# Patient Record
Sex: Female | Born: 1965 | Race: White | Hispanic: No | Marital: Married | Smoking: Never smoker
Health system: Southern US, Community
[De-identification: ages and names within clinical notes are randomized; demographics above are authoritative.]

## PROBLEM LIST (undated history)

## (undated) DIAGNOSIS — I493 Ventricular premature depolarization: Secondary | ICD-10-CM

## (undated) DIAGNOSIS — Z973 Presence of spectacles and contact lenses: Secondary | ICD-10-CM

## (undated) DIAGNOSIS — R112 Nausea with vomiting, unspecified: Secondary | ICD-10-CM

## (undated) DIAGNOSIS — Z9889 Other specified postprocedural states: Secondary | ICD-10-CM

## (undated) DIAGNOSIS — E039 Hypothyroidism, unspecified: Secondary | ICD-10-CM

## (undated) DIAGNOSIS — L749 Eccrine sweat disorder, unspecified: Secondary | ICD-10-CM

## (undated) DIAGNOSIS — I10 Essential (primary) hypertension: Secondary | ICD-10-CM

## (undated) DIAGNOSIS — D649 Anemia, unspecified: Secondary | ICD-10-CM

## (undated) DIAGNOSIS — E079 Disorder of thyroid, unspecified: Secondary | ICD-10-CM

## (undated) DIAGNOSIS — F419 Anxiety disorder, unspecified: Secondary | ICD-10-CM

## (undated) DIAGNOSIS — N941 Unspecified dyspareunia: Secondary | ICD-10-CM

## (undated) HISTORY — DX: Hypothyroidism, unspecified: E03.9

## (undated) HISTORY — PX: TONSILLECTOMY: SUR1361

## (undated) HISTORY — PX: TUBAL LIGATION: SHX77

## (undated) HISTORY — DX: Essential (primary) hypertension: I10

## (undated) HISTORY — DX: Anxiety disorder, unspecified: F41.9

## (undated) HISTORY — PX: ABDOMINAL HYSTERECTOMY: SHX81

## (undated) HISTORY — DX: Unspecified dyspareunia: N94.10

## (undated) HISTORY — PX: FOOT SURGERY: SHX648

## (undated) HISTORY — DX: Anemia, unspecified: D64.9

## (undated) HISTORY — DX: Eccrine sweat disorder, unspecified: L74.9

---

## 1997-05-05 ENCOUNTER — Other Ambulatory Visit: Admission: RE | Admit: 1997-05-05 | Discharge: 1997-05-05 | Payer: Self-pay | Admitting: Obstetrics and Gynecology

## 1997-07-05 ENCOUNTER — Ambulatory Visit (HOSPITAL_COMMUNITY): Admission: RE | Admit: 1997-07-05 | Discharge: 1997-07-05 | Payer: Self-pay | Admitting: Obstetrics and Gynecology

## 1998-05-16 ENCOUNTER — Other Ambulatory Visit: Admission: RE | Admit: 1998-05-16 | Discharge: 1998-05-16 | Payer: Self-pay | Admitting: *Deleted

## 1998-12-02 ENCOUNTER — Encounter: Payer: Self-pay | Admitting: *Deleted

## 1998-12-02 ENCOUNTER — Ambulatory Visit (HOSPITAL_COMMUNITY): Admission: RE | Admit: 1998-12-02 | Discharge: 1998-12-02 | Payer: Self-pay | Admitting: *Deleted

## 1999-07-13 ENCOUNTER — Ambulatory Visit (HOSPITAL_COMMUNITY): Admission: RE | Admit: 1999-07-13 | Discharge: 1999-07-13 | Payer: Self-pay | Admitting: *Deleted

## 1999-09-19 ENCOUNTER — Inpatient Hospital Stay (HOSPITAL_COMMUNITY): Admission: AD | Admit: 1999-09-19 | Discharge: 1999-09-19 | Payer: Self-pay | Admitting: *Deleted

## 1999-09-20 ENCOUNTER — Inpatient Hospital Stay (HOSPITAL_COMMUNITY): Admission: AD | Admit: 1999-09-20 | Discharge: 1999-09-22 | Payer: Self-pay | Admitting: Obstetrics & Gynecology

## 1999-10-20 ENCOUNTER — Other Ambulatory Visit: Admission: RE | Admit: 1999-10-20 | Discharge: 1999-10-20 | Payer: Self-pay | Admitting: *Deleted

## 2000-12-24 ENCOUNTER — Other Ambulatory Visit: Admission: RE | Admit: 2000-12-24 | Discharge: 2000-12-24 | Payer: Self-pay | Admitting: *Deleted

## 2002-02-02 ENCOUNTER — Other Ambulatory Visit: Admission: RE | Admit: 2002-02-02 | Discharge: 2002-02-02 | Payer: Self-pay | Admitting: *Deleted

## 2002-11-17 ENCOUNTER — Other Ambulatory Visit: Admission: RE | Admit: 2002-11-17 | Discharge: 2002-11-17 | Payer: Self-pay | Admitting: *Deleted

## 2002-12-02 ENCOUNTER — Inpatient Hospital Stay (HOSPITAL_COMMUNITY): Admission: AD | Admit: 2002-12-02 | Discharge: 2002-12-02 | Payer: Self-pay | Admitting: Obstetrics and Gynecology

## 2003-01-13 ENCOUNTER — Inpatient Hospital Stay (HOSPITAL_COMMUNITY): Admission: AD | Admit: 2003-01-13 | Discharge: 2003-01-13 | Payer: Self-pay | Admitting: Obstetrics and Gynecology

## 2003-03-18 ENCOUNTER — Ambulatory Visit (HOSPITAL_COMMUNITY): Admission: RE | Admit: 2003-03-18 | Discharge: 2003-03-18 | Payer: Self-pay | Admitting: Obstetrics and Gynecology

## 2003-06-01 ENCOUNTER — Inpatient Hospital Stay (HOSPITAL_COMMUNITY): Admission: AD | Admit: 2003-06-01 | Discharge: 2003-06-04 | Payer: Self-pay | Admitting: *Deleted

## 2003-07-07 ENCOUNTER — Other Ambulatory Visit: Admission: RE | Admit: 2003-07-07 | Discharge: 2003-07-07 | Payer: Self-pay | Admitting: Obstetrics & Gynecology

## 2004-08-15 ENCOUNTER — Other Ambulatory Visit: Admission: RE | Admit: 2004-08-15 | Discharge: 2004-08-15 | Payer: Self-pay | Admitting: Obstetrics and Gynecology

## 2005-11-23 ENCOUNTER — Emergency Department: Payer: Self-pay | Admitting: Emergency Medicine

## 2005-11-23 ENCOUNTER — Other Ambulatory Visit: Payer: Self-pay

## 2006-01-03 ENCOUNTER — Ambulatory Visit: Payer: Self-pay | Admitting: Obstetrics and Gynecology

## 2006-05-08 ENCOUNTER — Emergency Department: Payer: Self-pay | Admitting: Unknown Physician Specialty

## 2006-08-15 ENCOUNTER — Ambulatory Visit: Payer: Self-pay | Admitting: Gastroenterology

## 2006-08-16 ENCOUNTER — Ambulatory Visit: Payer: Self-pay | Admitting: Internal Medicine

## 2006-08-26 ENCOUNTER — Ambulatory Visit: Payer: Self-pay | Admitting: Gastroenterology

## 2006-09-10 ENCOUNTER — Ambulatory Visit: Payer: Self-pay | Admitting: Gastroenterology

## 2006-10-16 ENCOUNTER — Ambulatory Visit: Payer: Self-pay | Admitting: Internal Medicine

## 2007-03-13 ENCOUNTER — Ambulatory Visit: Payer: Self-pay | Admitting: Obstetrics and Gynecology

## 2007-05-28 ENCOUNTER — Ambulatory Visit: Payer: Self-pay | Admitting: Internal Medicine

## 2008-02-19 ENCOUNTER — Ambulatory Visit: Payer: Self-pay | Admitting: Internal Medicine

## 2008-03-16 ENCOUNTER — Ambulatory Visit: Payer: Self-pay | Admitting: Obstetrics and Gynecology

## 2008-09-14 ENCOUNTER — Other Ambulatory Visit: Payer: Self-pay | Admitting: Physician Assistant

## 2009-03-17 ENCOUNTER — Ambulatory Visit: Payer: Self-pay

## 2009-06-15 ENCOUNTER — Ambulatory Visit: Payer: Self-pay | Admitting: Obstetrics and Gynecology

## 2009-06-16 ENCOUNTER — Ambulatory Visit: Payer: Self-pay | Admitting: Obstetrics and Gynecology

## 2009-10-17 ENCOUNTER — Other Ambulatory Visit: Payer: Self-pay | Admitting: Physician Assistant

## 2009-11-25 ENCOUNTER — Other Ambulatory Visit: Payer: Self-pay

## 2010-01-06 ENCOUNTER — Other Ambulatory Visit: Payer: Self-pay

## 2010-01-06 ENCOUNTER — Ambulatory Visit: Payer: Self-pay | Admitting: General Practice

## 2010-01-12 ENCOUNTER — Ambulatory Visit: Payer: Self-pay | Admitting: General Practice

## 2010-05-02 ENCOUNTER — Ambulatory Visit: Payer: Self-pay

## 2010-08-16 ENCOUNTER — Ambulatory Visit: Payer: Self-pay | Admitting: Family Medicine

## 2010-08-17 ENCOUNTER — Ambulatory Visit: Payer: Self-pay | Admitting: Internal Medicine

## 2010-10-08 ENCOUNTER — Ambulatory Visit: Payer: Self-pay | Admitting: Family Medicine

## 2010-10-12 ENCOUNTER — Ambulatory Visit: Payer: Self-pay | Admitting: Internal Medicine

## 2011-02-25 ENCOUNTER — Ambulatory Visit: Payer: Self-pay | Admitting: Internal Medicine

## 2011-05-24 ENCOUNTER — Other Ambulatory Visit: Payer: Self-pay | Admitting: Internal Medicine

## 2011-05-24 LAB — COMPREHENSIVE METABOLIC PANEL
Albumin: 3.8 g/dL (ref 3.4–5.0)
Anion Gap: 9 (ref 7–16)
Chloride: 108 mmol/L — ABNORMAL HIGH (ref 98–107)
Co2: 27 mmol/L (ref 21–32)
Creatinine: 0.62 mg/dL (ref 0.60–1.30)
EGFR (African American): >60
EGFR (Non-African Amer.): >60
Glucose: 71 mg/dL (ref 65–99)
Osmolality: 284 (ref 275–301)
Potassium: 3.9 mmol/L (ref 3.5–5.1)
SGOT(AST): 21 U/L (ref 15–37)
SGPT (ALT): 18 U/L
Sodium: 144 mmol/L (ref 136–145)

## 2011-05-24 LAB — CBC WITH DIFFERENTIAL/PLATELET
Basophil #: 0 10*3/uL (ref 0.0–0.1)
Basophil %: 1.1 %
Eosinophil #: 0 10*3/uL (ref 0.0–0.7)
Lymphocyte %: 33.1 %
MCH: 25.2 pg — ABNORMAL LOW (ref 26.0–34.0)
MCV: 79 fL — ABNORMAL LOW (ref 80–100)
Neutrophil %: 56.9 %
Platelet: 192 10*3/uL (ref 150–440)
RBC: 4.48 10*6/uL (ref 3.80–5.20)

## 2011-05-29 ENCOUNTER — Ambulatory Visit: Payer: Self-pay | Admitting: Internal Medicine

## 2011-05-29 LAB — IRON AND TIBC
Iron Bind.Cap.(Total): 394 ug/dL (ref 250–450)
Iron: 14 ug/dL — ABNORMAL LOW (ref 50–170)
Unbound Iron-Bind.Cap.: 380 ug/dL

## 2011-06-21 ENCOUNTER — Ambulatory Visit: Payer: Self-pay | Admitting: General Practice

## 2011-09-27 ENCOUNTER — Other Ambulatory Visit: Payer: Self-pay | Admitting: Internal Medicine

## 2011-09-27 LAB — URINALYSIS, COMPLETE
Bacteria: NONE SEEN
Bilirubin,UR: NEGATIVE
Glucose,UR: NEGATIVE mg/dL (ref 0–75)
Ketone: NEGATIVE
Leukocyte Esterase: NEGATIVE
Nitrite: NEGATIVE
Ph: 6 (ref 4.5–8.0)
Protein: NEGATIVE
Specific Gravity: 1.024 (ref 1.003–1.030)
Squamous Epithelial: 3
WBC UR: NONE SEEN /HPF (ref 0–5)

## 2011-09-27 LAB — CBC WITH DIFFERENTIAL/PLATELET
Basophil #: 0 10*3/uL (ref 0.0–0.1)
Basophil %: 1 %
Eosinophil #: 0.1 10*3/uL (ref 0.0–0.7)
Eosinophil %: 1.9 %
HCT: 35.7 % (ref 35.0–47.0)
HGB: 12.2 g/dL (ref 12.0–16.0)
Lymphocyte #: 1.1 10*3/uL (ref 1.0–3.6)
Lymphocyte %: 29.2 %
MCH: 30.6 pg (ref 26.0–34.0)
MCHC: 34.1 g/dL (ref 32.0–36.0)
MCV: 90 fL (ref 80–100)
Monocyte #: 0.3 x10 3/mm (ref 0.2–0.9)
Monocyte %: 6.5 %
Neutrophil #: 2.4 10*3/uL (ref 1.4–6.5)
Neutrophil %: 61.4 %
Platelet: 163 10*3/uL (ref 150–440)
RBC: 3.98 10*6/uL (ref 3.80–5.20)
RDW: 14.3 % (ref 11.5–14.5)
WBC: 3.9 10*3/uL (ref 3.6–11.0)

## 2011-09-27 LAB — BASIC METABOLIC PANEL
Anion Gap: 4 — ABNORMAL LOW (ref 7–16)
BUN: 11 mg/dL (ref 7–18)
Calcium, Total: 8.7 mg/dL (ref 8.5–10.1)
Chloride: 103 mmol/L (ref 98–107)
Co2: 29 mmol/L (ref 21–32)
Creatinine: 0.69 mg/dL (ref 0.60–1.30)
EGFR (African American): >60
EGFR (Non-African Amer.): >60
Glucose: 83 mg/dL (ref 65–99)
Osmolality: 270 (ref 275–301)
Potassium: 3.9 mmol/L (ref 3.5–5.1)
Sodium: 136 mmol/L (ref 136–145)

## 2011-09-27 LAB — LIPID PANEL
Cholesterol: 160 mg/dL (ref 0–200)
HDL Cholesterol: 59 mg/dL (ref 40–60)
Ldl Cholesterol, Calc: 83 mg/dL (ref 0–100)
Triglycerides: 88 mg/dL (ref 0–200)
VLDL Cholesterol, Calc: 18 mg/dL (ref 5–40)

## 2011-09-27 LAB — FOLATE: Folic Acid: 15.8 ng/mL (ref 3.1–100.0)

## 2011-11-09 ENCOUNTER — Other Ambulatory Visit: Payer: Self-pay | Admitting: Podiatry

## 2011-11-09 LAB — SGOT (AST)(ARMC): SGOT(AST): 10 U/L — ABNORMAL LOW (ref 15–37)

## 2011-11-09 LAB — ALT: SGPT (ALT): 17 U/L (ref 12–78)

## 2012-01-04 ENCOUNTER — Ambulatory Visit: Payer: Self-pay | Admitting: Obstetrics and Gynecology

## 2012-06-19 ENCOUNTER — Ambulatory Visit: Payer: Self-pay | Admitting: Obstetrics and Gynecology

## 2012-06-19 LAB — BASIC METABOLIC PANEL
BUN: 10 mg/dL (ref 7–18)
Chloride: 105 mmol/L (ref 98–107)
Co2: 27 mmol/L (ref 21–32)
EGFR (African American): >60
EGFR (Non-African Amer.): >60
Glucose: 77 mg/dL (ref 65–99)
Osmolality: 270 (ref 275–301)
Sodium: 136 mmol/L (ref 136–145)

## 2012-06-19 LAB — CBC
HCT: 38.7 % (ref 35.0–47.0)
HGB: 13.3 g/dL (ref 12.0–16.0)
MCH: 30.4 pg (ref 26.0–34.0)
MCHC: 34.2 g/dL (ref 32.0–36.0)
RBC: 4.35 10*6/uL (ref 3.80–5.20)
RDW: 13.9 % (ref 11.5–14.5)
WBC: 4.6 10*3/uL (ref 3.6–11.0)

## 2012-06-23 ENCOUNTER — Ambulatory Visit: Payer: Self-pay | Admitting: Obstetrics and Gynecology

## 2012-06-24 LAB — HEMOGLOBIN: HGB: 12.2 g/dL (ref 12.0–16.0)

## 2012-06-24 LAB — CREATININE, SERUM
Creatinine: 0.63 mg/dL (ref 0.60–1.30)
EGFR (African American): >60

## 2012-09-17 ENCOUNTER — Other Ambulatory Visit: Payer: Self-pay | Admitting: Physician Assistant

## 2012-09-17 LAB — CBC WITH DIFFERENTIAL/PLATELET
Basophil #: 0 10*3/uL (ref 0.0–0.1)
Basophil %: 0.7 %
Eosinophil #: 0.2 10*3/uL (ref 0.0–0.7)
HGB: 13.6 g/dL (ref 12.0–16.0)
Lymphocyte #: 1.9 10*3/uL (ref 1.0–3.6)
Lymphocyte %: 32.9 %
Monocyte %: 6.5 %
Neutrophil #: 3.3 10*3/uL (ref 1.4–6.5)
Platelet: 194 10*3/uL (ref 150–440)
RBC: 4.55 10*6/uL (ref 3.80–5.20)
WBC: 5.9 10*3/uL (ref 3.6–11.0)

## 2012-09-17 LAB — BASIC METABOLIC PANEL
Anion Gap: 5 — ABNORMAL LOW (ref 7–16)
BUN: 15 mg/dL (ref 7–18)
Chloride: 103 mmol/L (ref 98–107)
Co2: 28 mmol/L (ref 21–32)
Creatinine: 0.73 mg/dL (ref 0.60–1.30)
EGFR (African American): >60
Glucose: 114 mg/dL — ABNORMAL HIGH (ref 65–99)

## 2012-10-21 ENCOUNTER — Ambulatory Visit: Payer: Self-pay | Admitting: Obstetrics and Gynecology

## 2013-01-14 ENCOUNTER — Ambulatory Visit: Payer: Self-pay | Admitting: General Practice

## 2013-08-17 DIAGNOSIS — M5412 Radiculopathy, cervical region: Secondary | ICD-10-CM | POA: Insufficient documentation

## 2013-08-17 DIAGNOSIS — M5030 Other cervical disc degeneration, unspecified cervical region: Secondary | ICD-10-CM | POA: Insufficient documentation

## 2013-10-16 ENCOUNTER — Ambulatory Visit: Payer: Self-pay | Admitting: Oncology

## 2013-10-16 LAB — RETICULOCYTES
Absolute Retic Count: 0.0419 10*6/uL (ref 0.019–0.186)
RETICULOCYTE: 0.9 % (ref 0.4–3.1)

## 2013-10-16 LAB — CBC CANCER CENTER
BASOS PCT: 1 %
Basophil #: 0.1 x10 3/mm (ref 0.0–0.1)
EOS PCT: 1.2 %
Eosinophil #: 0.1 x10 3/mm (ref 0.0–0.7)
HCT: 41.9 % (ref 35.0–47.0)
HGB: 13.5 g/dL (ref 12.0–16.0)
Lymphocyte #: 1.5 x10 3/mm (ref 1.0–3.6)
Lymphocyte %: 28.3 %
MCH: 29.2 pg (ref 26.0–34.0)
MCHC: 32.3 g/dL (ref 32.0–36.0)
MCV: 90 fL (ref 80–100)
MONOS PCT: 5.9 %
Monocyte #: 0.3 x10 3/mm (ref 0.2–0.9)
NEUTROS ABS: 3.3 x10 3/mm (ref 1.4–6.5)
Neutrophil %: 63.6 %
PLATELETS: 190 x10 3/mm (ref 150–440)
RBC: 4.63 10*6/uL (ref 3.80–5.20)
RDW: 14 % (ref 11.5–14.5)
WBC: 5.2 x10 3/mm (ref 3.6–11.0)

## 2013-10-16 LAB — IRON AND TIBC
IRON: 61 ug/dL (ref 50–170)
Iron Bind.Cap.(Total): 335 ug/dL (ref 250–450)
Iron Saturation: 18 %
Unbound Iron-Bind.Cap.: 274 ug/dL

## 2013-10-16 LAB — FERRITIN: Ferritin (ARMC): 12 ng/mL (ref 8–388)

## 2013-10-16 LAB — LACTATE DEHYDROGENASE: LDH: 154 U/L (ref 81–246)

## 2013-10-22 ENCOUNTER — Ambulatory Visit: Payer: Self-pay | Admitting: Oncology

## 2013-11-11 ENCOUNTER — Ambulatory Visit: Payer: Self-pay | Admitting: Obstetrics and Gynecology

## 2013-12-11 ENCOUNTER — Ambulatory Visit: Payer: Self-pay | Admitting: Oncology

## 2013-12-11 LAB — CBC CANCER CENTER
BASOS PCT: 0.8 %
Basophil #: 0 x10 3/mm (ref 0.0–0.1)
Eosinophil #: 0.1 x10 3/mm (ref 0.0–0.7)
Eosinophil %: 0.9 %
HCT: 42.8 % (ref 35.0–47.0)
HGB: 14.1 g/dL (ref 12.0–16.0)
LYMPHS PCT: 29.3 %
Lymphocyte #: 1.6 x10 3/mm (ref 1.0–3.6)
MCH: 30.7 pg (ref 26.0–34.0)
MCHC: 32.9 g/dL (ref 32.0–36.0)
MCV: 93 fL (ref 80–100)
Monocyte #: 0.4 x10 3/mm (ref 0.2–0.9)
Monocyte %: 7.4 %
NEUTROS PCT: 61.6 %
Neutrophil #: 3.3 x10 3/mm (ref 1.4–6.5)
Platelet: 188 x10 3/mm (ref 150–440)
RBC: 4.59 10*6/uL (ref 3.80–5.20)
RDW: 13.6 % (ref 11.5–14.5)
WBC: 5.3 x10 3/mm (ref 3.6–11.0)

## 2013-12-11 LAB — IRON AND TIBC
IRON BIND. CAP.(TOTAL): 299 ug/dL (ref 250–450)
Iron Saturation: 29 %
Iron: 87 ug/dL (ref 50–170)
Unbound Iron-Bind.Cap.: 212 ug/dL

## 2013-12-11 LAB — FERRITIN: Ferritin (ARMC): 84 ng/mL (ref 8–388)

## 2013-12-22 ENCOUNTER — Ambulatory Visit: Payer: Self-pay | Admitting: Oncology

## 2014-01-18 ENCOUNTER — Ambulatory Visit: Payer: Self-pay | Admitting: Obstetrics and Gynecology

## 2014-03-24 DIAGNOSIS — F411 Generalized anxiety disorder: Secondary | ICD-10-CM | POA: Insufficient documentation

## 2014-05-14 NOTE — Op Note (Signed)
PATIENT NAME:  Caitlin Washington, Caitlin Washington MR#:  [id number] DATE OF BIRTH:  1965-06-19  DATE OF PROCEDURE:  06/23/2012  PREOPERATIVE DIAGNOSES: 1. Dysmenorrhea.  2. Menorrhagia.  3. Uterine fibroids.   POSTOPERATIVE DIAGNOSES:  1. Dysmenorrhea. 2. Menorrhagia. 3. Uterine fibroids. 4. Endometriosis.   OPERATIVE PROCEDURE: LAVH, bilateral salpingo-oophorectomy.   SURGEON: Brayton Mars, M.D.   FIRST ASSISTANT: Herbert Moors, FNP and Owens Loffler, PA-S.   ANESTHESIA: General endotracheal.   INDICATIONS: The patient is a 49 year old, married, white female, para 2-0-1-2, status post BTL for contraception, who presents for definitive surgical management of severe dysmenorrhea and menorrhagia. The patient had preoperative ultrasound that demonstrated an intramural fibroid 2 cm in diameter. There was evidence of endometrial polyp 1 cm diameter as well. Adnexa were normal bilaterally.   FINDINGS AT SURGERY: Evidence of previous bilateral partial salpingectomy. The uterus appeared grossly normal. There was evidence of endometriosis with powder burn implant along the left uterine cornu as well as in the cul-de-sac. The ovaries appeared grossly normal. The upper abdomen was normal.   DESCRIPTION OF PROCEDURE: The patient was brought to the Operating Room where she was placed in the supine position. General endotracheal anesthesia was induced without difficulty. She was placed in the low lithotomy position using the bumblebee stirrups. A ChloraPrep and Betadine abdominal, perineal, intravaginal prep and drape was performed in the standard fashion. A Foley catheter was placed into the bladder and was draining clear yellow urine. A Hulka tenaculum was placed onto the cervix to facilitate uterine manipulation. Subumbilical vertical incision was made around a previously identified scar. The scar was excised. Direct entry was made in the abdomen with a 5 mm Optiview laparoscopic trocar under direct  visualization. No bowel or vascular injury was encountered. Pneumoperitoneum with CO2 gas was created. Two other 5 mm ports were placed in the right and left lower quadrants respectively under direct visualization. The above-noted findings were photo documented. The procedure was then performed in the standard fashion. The Ace Harmonic scalpel along with Kleppinger bipolar forceps and graspers were used to facilitate the procedure. The round ligaments were transected. This was done bilaterally. The infundibulopelvic ligaments were clamps and coagulated using the Harmonic scalpel. The mesosalpinx also was clamped, coagulated, and cut using the Harmonic scalpel. The broad and cardinal ligament complexes were then taken down in standard fashion using the Harmonic scalpel. This was carried out down to the level of the lower uterine segment near the region of the uterosacral ligament. The bladder flap was dissected off with sharp and blunt dissection. The uterine arteries were coagulated with the Kleppinger bipolar forceps. Once satisfied with taking down all ligamentous structures, the intra-abdominal part of the procedure was suspended and the vaginal portion of the procedure was then performed. The patient was placed in the dorsal lithotomy position and a weighted speculum was placed into the vagina. A Hulka tenaculum was removed and a double-tooth tenaculum was placed onto the cervix. Posterior colpotomy was made with Mayo scissors. Uterosacral ligaments were clamped, cut, and stick tied using 0 Vicryl sutures. The remainder of the cardinal broad ligament complexes were clamped, cut, and stick tied using 0 Vicryl suture. The specimen was then removed from the operative field. The posterior cuff was run using a 0 chromic suture in a baseball stitch manner. Following this, the peritoneum was reapproximated using a 0 Vicryl pursestring stitch. The vagina was then closed using simple interrupted sutures of 2-0 chromic.  Following completion of the vaginal portion of the procedure,  repeat laparoscopy was then performed with verification of hemostasis of all pedicles. The pelvis was irrigated. The irrigant fluid was aspirated. The procedure was then terminated with all instrumentation being removed from the abdominopelvic cavity. Pneumoperitoneum was released. The incisions were closed with Dermabond glue and two 4-0 Vicryl sutures placed in the subumbilical incision. The patient was then awakened, extubated, and taken to the recovery room in satisfactory condition.   ESTIMATED BLOOD LOSS: 100 mL.  IV FLUIDS:  1300 mL.  URINE OUTPUT:  Was not yet quantified.     ____________________________ Alanda Slim Rola Lennon, MD mad:rw D: 06/23/2012 14:38:23 ET T: 06/23/2012 20:20:33 ET JOB#: [id number]  cc: Hassell Done A. Hayato Guaman, MD, <Dictator> [facility] MD ELECTRONICALLY SIGNED 07/04/2012 10:40

## 2014-08-11 ENCOUNTER — Ambulatory Visit: Payer: Self-pay | Admitting: Obstetrics and Gynecology

## 2014-09-28 DIAGNOSIS — F411 Generalized anxiety disorder: Secondary | ICD-10-CM | POA: Insufficient documentation

## 2014-10-22 DIAGNOSIS — I493 Ventricular premature depolarization: Secondary | ICD-10-CM | POA: Insufficient documentation

## 2014-10-27 ENCOUNTER — Ambulatory Visit: Payer: Self-pay

## 2014-10-28 ENCOUNTER — Ambulatory Visit: Payer: Self-pay | Admitting: Physician Assistant

## 2014-10-28 ENCOUNTER — Encounter: Payer: Self-pay | Admitting: Physician Assistant

## 2014-10-28 VITALS — BP 112/80

## 2014-10-28 DIAGNOSIS — D510 Vitamin B12 deficiency anemia due to intrinsic factor deficiency: Secondary | ICD-10-CM

## 2014-10-28 MED ORDER — CYANOCOBALAMIN 1000 MCG/ML IJ SOLN
1000.0000 ug | Freq: Once | INTRAMUSCULAR | Status: AC
  Administered 2014-10-28: 1000 ug via INTRAMUSCULAR

## 2014-11-25 ENCOUNTER — Ambulatory Visit: Payer: Self-pay | Admitting: Physician Assistant

## 2014-12-02 ENCOUNTER — Ambulatory Visit: Payer: Self-pay | Admitting: Physician Assistant

## 2014-12-02 DIAGNOSIS — E538 Deficiency of other specified B group vitamins: Secondary | ICD-10-CM

## 2014-12-02 MED ORDER — CYANOCOBALAMIN 1000 MCG/ML IJ SOLN: 1000.0000 ug | Freq: Once | INTRAMUSCULAR | Status: DC

## 2014-12-02 MED ORDER — CYANOCOBALAMIN 1000 MCG/ML IJ SOLN
1000.0000 ug | Freq: Once | INTRAMUSCULAR | Status: AC
  Administered 2014-12-02: 1000 ug via INTRAMUSCULAR

## 2014-12-31 ENCOUNTER — Ambulatory Visit: Payer: Self-pay | Admitting: Physician Assistant

## 2014-12-31 DIAGNOSIS — D510 Vitamin B12 deficiency anemia due to intrinsic factor deficiency: Secondary | ICD-10-CM

## 2014-12-31 MED ORDER — CYANOCOBALAMIN 1000 MCG/ML IJ SOLN
1000.0000 ug | Freq: Once | INTRAMUSCULAR | Status: AC
  Administered 2014-12-31: 1000 ug via INTRAMUSCULAR

## 2014-12-31 NOTE — Progress Notes (Signed)
Here for b12 injection, has order by her md

## 2015-01-22 ENCOUNTER — Ambulatory Visit
Admission: EM | Admit: 2015-01-22 | Discharge: 2015-01-22 | Disposition: A | Discharge status: Home / Self Care | Payer: 59 | Attending: Family Medicine | Admitting: Family Medicine

## 2015-01-22 ENCOUNTER — Encounter: Payer: Self-pay | Admitting: Emergency Medicine

## 2015-01-22 DIAGNOSIS — J029 Acute pharyngitis, unspecified: Secondary | ICD-10-CM | POA: Diagnosis not present

## 2015-01-22 HISTORY — DX: Ventricular premature depolarization: I49.3

## 2015-01-22 HISTORY — DX: Disorder of thyroid, unspecified: E07.9

## 2015-01-22 LAB — RAPID STREP SCREEN (MED CTR MEBANE ONLY): Streptococcus, Group A Screen (Direct): NEGATIVE

## 2015-01-22 MED ORDER — LEVOFLOXACIN 500 MG PO TABS: 500.0000 mg | ORAL_TABLET | Freq: Every day | ORAL | Status: DC

## 2015-01-22 NOTE — ED Provider Notes (Signed)
Patient presents today with symptoms of sore throat, headache, right sinus/facial swelling for the last few days. Patient also has had some muscle aches. She denies any high fever, chest pain, shortness of breath, productive cough. Most of her sore throat symptoms are on the right.  ROS: Negative except mentioned above.  Vitals as per Epic.  GENERAL: NAD HEENT: mild pharyngeal erythema, no exudate, no erythema of TMs, mild tenderness of right maxillary sinus, mild right cervical LAD RESP: CTA B CARD: RRR NEURO: CN II-XII grossly intact  A/P: Pharyngitis, sinus tenderness- rapid strep test was negative, will treat with Levaquin given patient's allergies to amoxicillin and sulfa. Patient states that oftentimes the Z-Pak does not work for her. Patient does have risk of QT prolongation given her use of Celexa along with antibiotic. This was discussed with the patient. She states she has used Levaquin in the past without any problems.She will review this with the pharmacist. She will follow up with her primary care physician or seek other medical attention if needed next week.   Paulina Fusi, MD 01/22/15 [phone]

## 2015-01-22 NOTE — ED Notes (Signed)
Patient c/o sore throat and HAs for the past couple of days.

## 2015-01-24 LAB — CULTURE, GROUP A STREP (THRC)

## 2015-02-04 ENCOUNTER — Ambulatory Visit: Payer: Self-pay | Admitting: Physician Assistant

## 2015-02-07 ENCOUNTER — Ambulatory Visit: Payer: Self-pay | Admitting: Physician Assistant

## 2015-02-07 DIAGNOSIS — D510 Vitamin B12 deficiency anemia due to intrinsic factor deficiency: Secondary | ICD-10-CM

## 2015-02-07 MED ORDER — CYANOCOBALAMIN 1000 MCG/ML IJ SOLN
1000.0000 ug | Freq: Once | INTRAMUSCULAR | Status: AC
  Administered 2015-02-07: 1000 ug via INTRAMUSCULAR

## 2015-02-07 NOTE — Progress Notes (Signed)
Here for b12 injection as ordered by her MD

## 2015-03-10 ENCOUNTER — Ambulatory Visit: Payer: Self-pay | Admitting: Physician Assistant

## 2015-03-11 ENCOUNTER — Ambulatory Visit: Payer: Self-pay | Admitting: Physician Assistant

## 2015-03-17 DIAGNOSIS — L608 Other nail disorders: Secondary | ICD-10-CM | POA: Diagnosis not present

## 2015-03-17 DIAGNOSIS — M79672 Pain in left foot: Secondary | ICD-10-CM | POA: Diagnosis not present

## 2015-03-17 DIAGNOSIS — A499 Bacterial infection, unspecified: Secondary | ICD-10-CM | POA: Diagnosis not present

## 2015-03-17 DIAGNOSIS — B351 Tinea unguium: Secondary | ICD-10-CM | POA: Diagnosis not present

## 2015-03-17 DIAGNOSIS — M2012 Hallux valgus (acquired), left foot: Secondary | ICD-10-CM | POA: Diagnosis not present

## 2015-03-25 ENCOUNTER — Ambulatory Visit: Payer: Self-pay | Admitting: Physician Assistant

## 2015-03-25 DIAGNOSIS — D510 Vitamin B12 deficiency anemia due to intrinsic factor deficiency: Secondary | ICD-10-CM

## 2015-03-25 MED ORDER — CYANOCOBALAMIN 1000 MCG/ML IJ SOLN
1000.0000 ug | Freq: Once | INTRAMUSCULAR | Status: AC
  Administered 2015-03-25: 1000 ug via INTRAMUSCULAR

## 2015-03-25 MED ORDER — CYANOCOBALAMIN 1000 MCG/ML IJ SOLN: 1000.0000 ug | Freq: Once | INTRAMUSCULAR | Status: AC

## 2015-03-25 NOTE — Progress Notes (Signed)
Patient ID: Caitlin Washington, female   DOB: Dec 31, 1965, 50 y.o.   MRN: [id number] Patient in office today for B12 injection only given in left deltoid I.M   Lot# [id number] exp# 05/2016.

## 2015-04-05 ENCOUNTER — Encounter: Payer: Self-pay | Admitting: *Deleted

## 2015-04-07 ENCOUNTER — Encounter: Payer: Self-pay | Admitting: Obstetrics and Gynecology

## 2015-04-07 ENCOUNTER — Other Ambulatory Visit: Payer: Self-pay | Admitting: Obstetrics and Gynecology

## 2015-04-07 ENCOUNTER — Ambulatory Visit (INDEPENDENT_AMBULATORY_CARE_PROVIDER_SITE_OTHER): Payer: 59 | Admitting: Obstetrics and Gynecology

## 2015-04-07 VITALS — BP 123/81 | HR 63 | Ht 67.0 in | Wt 143.1 lb

## 2015-04-07 DIAGNOSIS — Z01419 Encounter for gynecological examination (general) (routine) without abnormal findings: Secondary | ICD-10-CM | POA: Diagnosis not present

## 2015-04-07 DIAGNOSIS — R5382 Chronic fatigue, unspecified: Secondary | ICD-10-CM

## 2015-04-07 DIAGNOSIS — Z01411 Encounter for gynecological examination (general) (routine) with abnormal findings: Secondary | ICD-10-CM | POA: Diagnosis not present

## 2015-04-07 MED ORDER — ESTRADIOL 0.0375 MG/24HR TD PTTW: 1.0000 | MEDICATED_PATCH | TRANSDERMAL | Status: DC

## 2015-04-07 MED ORDER — LEVOTHYROXINE SODIUM 88 MCG PO TABS: 125.0000 ug | ORAL_TABLET | Freq: Every day | ORAL | Status: DC

## 2015-04-07 MED ORDER — CITALOPRAM HYDROBROMIDE 10 MG PO TABS: 10.0000 mg | ORAL_TABLET | Freq: Every day | ORAL | Status: DC

## 2015-04-07 MED ORDER — METOPROLOL SUCCINATE ER 25 MG PO TB24: 25.0000 mg | ORAL_TABLET | Freq: Every day | ORAL | Status: DC

## 2015-04-07 MED ORDER — ALPRAZOLAM 0.25 MG PO TABS: 0.2500 mg | ORAL_TABLET | Freq: Two times a day (BID) | ORAL | Status: DC | PRN

## 2015-04-07 NOTE — Progress Notes (Signed)
Subjective:   Caitlin Washington is a 50 y.o. No obstetric history on file. Caucasian female here for a routine well-woman exam.  No LMP recorded. Patient has had a hysterectomy.    Current complaints: fatigue PCP: ?       Does  desire labs  Social History: Sexual: heterosexual Marital Status: married Living situation: with family Occupation: Glass blower/designer at [employer]: no tobacco use Illicit drugs: no history of illicit drug use  The following portions of the patient's history were reviewed and updated as appropriate: allergies, current medications, past family history, past medical history, past social history, past surgical history and problem list.  Past Medical History Past Medical History  Diagnosis Date  . Thyroid disease   . PVC (premature ventricular contraction)   . Hypertension   . Anemia   . Hypothyroidism   . Anxiety   . Sweating abnormality   . Dyspareunia in female     Past Surgical History Past Surgical History  Procedure Laterality Date  . Abdominal hysterectomy    . Tonsillectomy    . Tubal ligation      Gynecologic History No obstetric history on file.  No LMP recorded. Patient has had a hysterectomy. Contraception: status post hysterectomy Last Pap: 2013. Results were: normal Last mammogram: 2016. Results were: normal   Obstetric History OB History  No data available    Current Medications Current Outpatient Prescriptions on File Prior to Visit  Medication Sig Dispense Refill  . ALPRAZolam (XANAX) 0.25 MG tablet TAKE ONE TABLET BY MOUTH ONCE DAILY AS NEEDED FOR SLEEP    . citalopram (CELEXA) 20 MG tablet Take 10 mg by mouth daily.     . cyanocobalamin (,VITAMIN B-12,) 1000 MCG/ML injection INJECT 1 ML INTRAMUSCULARLY ONCE A MONTH    . cyanocobalamin (,VITAMIN B-12,) 1000 MCG/ML injection Inject 1 mL (1,000 mcg total) into the muscle once. 1 mL 0  . estradiol (VIVELLE-DOT) 0.0375 MG/24HR   4  . estradiol (VIVELLE-DOT) 0.075  MG/24HR Place onto the skin.    . fluticasone (FLONASE) 50 MCG/ACT nasal spray Place into the nose.    . levothyroxine (SYNTHROID, LEVOTHROID) 88 MCG tablet Take 125 mcg by mouth.     . metoprolol succinate (TOPROL-XL) 25 MG 24 hr tablet Take by mouth.    . Calcium Carbonate-Vitamin D 600-400 MG-UNIT tablet Take by mouth. Reported on 04/07/2015    . levofloxacin (LEVAQUIN) 500 MG tablet Take 1 tablet (500 mg total) by mouth daily. (Patient not taking: Reported on 02/07/2015) 7 tablet 0  . vitamin E 400 UNIT capsule Take by mouth. Reported on 04/07/2015     No current facility-administered medications on file prior to visit.    Review of Systems Patient denies any headaches, blurred vision, shortness of breath, chest pain, abdominal pain, problems with bowel movements, urination, or intercourse.  Objective:  BP 123/81 mmHg  Pulse 63  Ht 5\' 7"  (1.702 m)  Wt 143 lb 1.6 oz (64.91 kg)  BMI 22.41 kg/m2 Physical Exam  General:  Well developed, well nourished, no acute distress. She is alert and oriented x3. Skin:  Warm and dry Neck:  Midline trachea, no thyromegaly or nodules Cardiovascular: Regular rate and rhythm, no murmur heard Lungs:  Effort normal, all lung fields clear to auscultation bilaterally Breasts:  No dominant palpable mass, retraction, or nipple discharge Abdomen:  Soft, non tender, no hepatosplenomegaly or masses Pelvic:  External genitalia is normal in appearance.  The vagina is normal in appearance. The cervical  cuff is normal in appearance.  Thin prep pap is done with HR HPV cotesting. Uterus is surgically absent.  No adnexal masses or tenderness noted. Extremities:  No swelling or varicosities noted Psych:  She has a normal mood and affect  Assessment:   Healthy well-woman exam S/p total hysterectomy Fatigue   Plan:  Labs ontained F/U 1 year  for AE, or sooner if needed Mammogram scheduled  Melody Rockney Ghee, CNM

## 2015-04-07 NOTE — Patient Instructions (Signed)
Place annual gynecologic exam patient instructions here.

## 2015-04-08 LAB — COMPREHENSIVE METABOLIC PANEL
ALK PHOS: 62 IU/L (ref 39–117)
ALT: 11 IU/L (ref 0–32)
AST: 18 IU/L (ref 0–40)
Albumin/Globulin Ratio: 1.6 (ref 1.2–2.2)
Albumin: 5 g/dL (ref 3.5–5.5)
BUN/Creatinine Ratio: 15 (ref 9–23)
BUN: 13 mg/dL (ref 6–24)
Bilirubin Total: 0.7 mg/dL (ref 0.0–1.2)
CALCIUM: 9.9 mg/dL (ref 8.7–10.2)
CO2: 21 mmol/L (ref 18–29)
CREATININE: 0.84 mg/dL (ref 0.57–1.00)
Chloride: 103 mmol/L (ref 96–106)
GFR calc Af Amer: 94 mL/min/{1.73_m2} (ref >59)
GFR, EST NON AFRICAN AMERICAN: 81 mL/min/{1.73_m2} (ref >59)
GLUCOSE: 75 mg/dL (ref 65–99)
Globulin, Total: 3.2 g/dL (ref 1.5–4.5)
Potassium: 4.1 mmol/L (ref 3.5–5.2)
Sodium: 147 mmol/L — ABNORMAL HIGH (ref 134–144)
Total Protein: 8.2 g/dL (ref 6.0–8.5)

## 2015-04-08 LAB — THYROID PANEL WITH TSH
Free Thyroxine Index: 3 (ref 1.2–4.9)
T3 Uptake Ratio: 25 % (ref 24–39)
T4 TOTAL: 12.1 ug/dL — AB (ref 4.5–12.0)
TSH: 1.46 u[IU]/mL (ref 0.450–4.500)

## 2015-04-08 LAB — LIPID PANEL
CHOLESTEROL TOTAL: 184 mg/dL (ref 100–199)
Chol/HDL Ratio: 2.7 ratio units (ref 0.0–4.4)
HDL: 67 mg/dL (ref >39)
LDL CALC: 100 mg/dL — AB (ref 0–99)
Triglycerides: 84 mg/dL (ref 0–149)
VLDL CHOLESTEROL CAL: 17 mg/dL (ref 5–40)

## 2015-04-08 LAB — IRON: IRON: 124 ug/dL (ref 27–159)

## 2015-04-08 LAB — VITAMIN D 25 HYDROXY (VIT D DEFICIENCY, FRACTURES): Vit D, 25-Hydroxy: 37.6 ng/mL (ref 30.0–100.0)

## 2015-04-08 LAB — VITAMIN B12: Vitamin B-12: 580 pg/mL (ref 211–946)

## 2015-04-11 LAB — CYTOLOGY - PAP

## 2015-04-13 DIAGNOSIS — E039 Hypothyroidism, unspecified: Secondary | ICD-10-CM | POA: Diagnosis not present

## 2015-04-13 DIAGNOSIS — R002 Palpitations: Secondary | ICD-10-CM | POA: Diagnosis not present

## 2015-04-13 DIAGNOSIS — F411 Generalized anxiety disorder: Secondary | ICD-10-CM | POA: Diagnosis not present

## 2015-04-20 DIAGNOSIS — Z1239 Encounter for other screening for malignant neoplasm of breast: Secondary | ICD-10-CM | POA: Diagnosis not present

## 2015-04-20 DIAGNOSIS — I493 Ventricular premature depolarization: Secondary | ICD-10-CM | POA: Diagnosis not present

## 2015-04-20 DIAGNOSIS — Z0000 Encounter for general adult medical examination without abnormal findings: Secondary | ICD-10-CM | POA: Diagnosis not present

## 2015-04-20 DIAGNOSIS — F411 Generalized anxiety disorder: Secondary | ICD-10-CM | POA: Diagnosis not present

## 2015-04-29 ENCOUNTER — Ambulatory Visit: Payer: Self-pay | Admitting: Physician Assistant

## 2015-05-11 DIAGNOSIS — Z8371 Family history of colonic polyps: Secondary | ICD-10-CM | POA: Diagnosis not present

## 2015-05-11 DIAGNOSIS — Z1211 Encounter for screening for malignant neoplasm of colon: Secondary | ICD-10-CM | POA: Diagnosis not present

## 2015-06-07 ENCOUNTER — Other Ambulatory Visit: Payer: Self-pay | Admitting: Obstetrics and Gynecology

## 2015-06-17 ENCOUNTER — Other Ambulatory Visit: Payer: Self-pay | Admitting: Obstetrics and Gynecology

## 2015-06-22 ENCOUNTER — Encounter: Payer: Self-pay | Admitting: *Deleted

## 2015-06-23 ENCOUNTER — Ambulatory Visit: Payer: 59 | Admitting: Anesthesiology

## 2015-06-23 ENCOUNTER — Ambulatory Visit
Admission: RE | Admit: 2015-06-23 | Discharge: 2015-06-23 | Disposition: A | Discharge status: Home / Self Care | Payer: 59 | Source: Ambulatory Visit | Attending: Unknown Physician Specialty | Admitting: Unknown Physician Specialty

## 2015-06-23 ENCOUNTER — Encounter
Admission: RE | Disposition: A | Discharge status: Home / Self Care | Payer: Self-pay | Source: Ambulatory Visit | Attending: Unknown Physician Specialty

## 2015-06-23 DIAGNOSIS — Z1211 Encounter for screening for malignant neoplasm of colon: Secondary | ICD-10-CM | POA: Diagnosis not present

## 2015-06-23 DIAGNOSIS — E039 Hypothyroidism, unspecified: Secondary | ICD-10-CM | POA: Insufficient documentation

## 2015-06-23 DIAGNOSIS — F419 Anxiety disorder, unspecified: Secondary | ICD-10-CM | POA: Insufficient documentation

## 2015-06-23 DIAGNOSIS — D649 Anemia, unspecified: Secondary | ICD-10-CM | POA: Insufficient documentation

## 2015-06-23 DIAGNOSIS — I10 Essential (primary) hypertension: Secondary | ICD-10-CM | POA: Insufficient documentation

## 2015-06-23 DIAGNOSIS — Z79899 Other long term (current) drug therapy: Secondary | ICD-10-CM | POA: Diagnosis not present

## 2015-06-23 DIAGNOSIS — K648 Other hemorrhoids: Secondary | ICD-10-CM | POA: Diagnosis not present

## 2015-06-23 DIAGNOSIS — K640 First degree hemorrhoids: Secondary | ICD-10-CM | POA: Insufficient documentation

## 2015-06-23 DIAGNOSIS — Z8371 Family history of colonic polyps: Secondary | ICD-10-CM | POA: Diagnosis not present

## 2015-06-23 HISTORY — PX: COLONOSCOPY WITH PROPOFOL: SHX5780

## 2015-06-23 HISTORY — DX: Other specified postprocedural states: R11.2

## 2015-06-23 HISTORY — DX: Other specified postprocedural states: Z98.890

## 2015-06-23 SURGERY — COLONOSCOPY WITH PROPOFOL
Anesthesia: General

## 2015-06-23 MED ORDER — PROPOFOL 500 MG/50ML IV EMUL
INTRAVENOUS | Status: DC | PRN
  Administered 2015-06-23: 120 ug/kg/min via INTRAVENOUS

## 2015-06-23 MED ORDER — ONDANSETRON HCL 4 MG/2ML IJ SOLN
INTRAMUSCULAR | Status: DC | PRN
Start: 2015-06-23 — End: 2015-06-23
  Administered 2015-06-23: 4 mg via INTRAVENOUS

## 2015-06-23 MED ORDER — FENTANYL CITRATE (PF) 100 MCG/2ML IJ SOLN
INTRAMUSCULAR | Status: DC | PRN
Start: 2015-06-23 — End: 2015-06-23
  Administered 2015-06-23: 50 ug via INTRAVENOUS

## 2015-06-23 MED ORDER — EPHEDRINE SULFATE 50 MG/ML IJ SOLN
INTRAMUSCULAR | Status: DC | PRN
  Administered 2015-06-23 (×2): 5 mg via INTRAVENOUS

## 2015-06-23 MED ORDER — SODIUM CHLORIDE 0.9 % IV SOLN: INTRAVENOUS | Status: DC

## 2015-06-23 MED ORDER — MIDAZOLAM HCL 2 MG/2ML IJ SOLN
INTRAMUSCULAR | Status: DC | PRN
  Administered 2015-06-23: 1 mg via INTRAVENOUS

## 2015-06-23 MED ORDER — SODIUM CHLORIDE 0.9 % IV SOLN
INTRAVENOUS | Status: DC
  Administered 2015-06-23: 1000 mL via INTRAVENOUS

## 2015-06-23 NOTE — Anesthesia Procedure Notes (Signed)
Performed by: COOK-MARTIN, Zyrion Coey Pre-anesthesia Checklist: Patient identified, Emergency Drugs available, Suction available, Patient being monitored and Timeout performed Patient Re-evaluated:Patient Re-evaluated prior to induction Oxygen Delivery Method: Nasal cannula Preoxygenation: Pre-oxygenation with 100% oxygen Placement Confirmation: CO2 detector and positive ETCO2       

## 2015-06-23 NOTE — Anesthesia Preprocedure Evaluation (Signed)
Anesthesia Evaluation  Patient identified by MRN, date of birth, ID band Patient awake    Reviewed: Allergy & Precautions, H&P , NPO status , Patient's Chart, lab work & pertinent test results, reviewed documented beta blocker date and time   Airway Mallampati: II   Neck ROM: full    Dental  (+) Poor Dentition   Pulmonary neg pulmonary ROS,    Pulmonary exam normal        Cardiovascular hypertension, negative cardio ROS Normal cardiovascular exam     Neuro/Psych PSYCHIATRIC DISORDERS  Neuromuscular disease negative neurological ROS  negative psych ROS   GI/Hepatic negative GI ROS, Neg liver ROS,   Endo/Other  negative endocrine ROSHypothyroidism   Renal/GU negative Renal ROS  negative genitourinary   Musculoskeletal   Abdominal   Peds  Hematology negative hematology ROS (+) anemia ,   Anesthesia Other Findings Past Medical History:   Thyroid disease                                              PVC (premature ventricular contraction)                      Hypertension                                                 Anemia                                                       Hypothyroidism                                               Anxiety                                                      Sweating abnormality                                         Dyspareunia in female                                        PONV (postoperative nausea and vomiting)                   Past Surgical History:   ABDOMINAL HYSTERECTOMY                                        TONSILLECTOMY  TUBAL LIGATION                                              BMI    Body Mass Index   23.48 kg/m 2     Reproductive/Obstetrics                             Anesthesia Physical Anesthesia Plan  ASA: II  Anesthesia Plan: General   Post-op Pain Management:     Induction:   Airway Management Planned:   Additional Equipment:   Intra-op Plan:   Post-operative Plan:   Informed Consent: I have reviewed the patients History and Physical, chart, labs and discussed the procedure including the risks, benefits and alternatives for the proposed anesthesia with the patient or authorized representative who has indicated his/her understanding and acceptance.   Dental Advisory Given  Plan Discussed with: CRNA  Anesthesia Plan Comments:         Anesthesia Quick Evaluation

## 2015-06-23 NOTE — H&P (Signed)
Primary Care Physician:  Tracie Harrier, MD Primary Gastroenterologist:  Dr. Vira Agar  Pre-Procedure History & Physical: HPI:  Caitlin Washington is a 50 y.o. female is here for an colonoscopy.   Past Medical History  Diagnosis Date  . Thyroid disease   . PVC (premature ventricular contraction)   . Hypertension   . Anemia   . Hypothyroidism   . Anxiety   . Sweating abnormality   . Dyspareunia in female   . PONV (postoperative nausea and vomiting)     Past Surgical History  Procedure Laterality Date  . Abdominal hysterectomy    . Tonsillectomy    . Tubal ligation      Prior to Admission medications   Medication Sig Start Date End Date Taking? Authorizing Provider  ferrous sulfate 325 (65 FE) MG tablet Take 325 mg by mouth 2 (two) times daily with a meal.   Yes Historical Provider, MD  ALPRAZolam (XANAX) 0.25 MG tablet Take 1 tablet (0.25 mg total) by mouth 2 (two) times daily as needed for anxiety. 04/07/15   Melody N Shambley, CNM  Calcium Carbonate-Vitamin D 600-400 MG-UNIT tablet Take by mouth. Reported on 04/07/2015    Historical Provider, MD  citalopram (CELEXA) 10 MG tablet TAKE 1 TABLET BY MOUTH ONCE DAILY 06/07/15   Melody N Shambley, CNM  cyanocobalamin (,VITAMIN B-12,) 1000 MCG/ML injection INJECT 1 ML INTRAMUSCULARLY ONCE A MONTH 04/20/14   Historical Provider, MD  cyanocobalamin (,VITAMIN B-12,) 1000 MCG/ML injection Inject 1 mL (1,000 mcg total) into the muscle once. 03/25/15   Sable Feil, PA-C  estradiol (VIVELLE-DOT) 0.0375 MG/24HR Place 1 patch onto the skin 2 (two) times a week. 04/07/15   Melody N Shambley, CNM  estradiol (VIVELLE-DOT) 0.075 MG/24HR Place onto the skin.    Historical Provider, MD  fluticasone (FLONASE) 50 MCG/ACT nasal spray Place into the nose.    Historical Provider, MD  levofloxacin (LEVAQUIN) 500 MG tablet Take 1 tablet (500 mg total) by mouth daily. Patient not taking: Reported on 02/07/2015 01/22/15   Paulina Fusi, MD  levothyroxine  (SYNTHROID, LEVOTHROID) 88 MCG tablet Take 1.5 tablets (132 mcg total) by mouth daily before breakfast. 04/07/15 04/06/16  Melody N Shambley, CNM  metoprolol succinate (TOPROL-XL) 25 MG 24 hr tablet Take 1 tablet (25 mg total) by mouth daily. 04/07/15 04/06/16  Melody N Shambley, CNM  progesterone (PROMETRIUM) 100 MG capsule TAKE 1 CAPSULE BY MOUTH NIGHTLY AT BEDTIME 06/17/15   Melody N Shambley, CNM  vitamin E 400 UNIT capsule Take by mouth. Reported on 04/07/2015    Historical Provider, MD    Allergies as of 06/13/2015 - Review Complete 04/07/2015  Allergen Reaction Noted  . Amoxicillin Other (See Comments) 10/28/2014  . Sulfa antibiotics Other (See Comments) 10/28/2014    Family History  Problem Relation Age of Onset  . Hypertension Father   . Heart disease Maternal Grandmother   . Heart disease Paternal Grandfather     Social History   Social History  . Marital Status: Married    Spouse Name: N/A  . Number of Children: N/A  . Years of Education: N/A   Occupational History  . Not on file.   Social History Main Topics  . Smoking status: Never Smoker   . Smokeless tobacco: Never Used  . Alcohol Use: 0.0 oz/week    0 Standard drinks or equivalent per week  . Drug Use: No  . Sexual Activity: Yes   Other Topics Concern  . Not on file  Social History Narrative    Review of Systems: See HPI, otherwise negative ROS  Physical Exam: BP 130/85 mmHg  Pulse 84  Temp(Src) 97.5 F (36.4 C) (Tympanic)  Resp 16  Ht 5\' 7"  (1.702 m)  Wt 68.04 kg (150 lb)  BMI 23.49 kg/m2  SpO2 100% General:   Alert,  pleasant and cooperative in NAD Head:  Normocephalic and atraumatic. Neck:  Supple; no masses or thyromegaly. Lungs:  Clear throughout to auscultation.    Heart:  Regular rate and rhythm. Abdomen:  Soft, nontender and nondistended. Normal bowel sounds, without guarding, and without rebound.   Neurologic:  Alert and  oriented x4;  grossly normal  neurologically.  Impression/Plan: Caitlin Washington is here for an colonoscopy to be performed for screening  Risks, benefits, limitations, and alternatives regarding  colonoscopy have been reviewed with the patient.  Questions have been answered.  All parties agreeable.   Gaylyn Cheers, MD  06/23/2015, 10:23 AM

## 2015-06-23 NOTE — Transfer of Care (Signed)
Immediate Anesthesia Transfer of Care Note  Patient: Caitlin Washington  Procedure(s) Performed: Procedure(s): COLONOSCOPY WITH PROPOFOL (N/A)  Patient Location: PACU  Anesthesia Type:General  Level of Consciousness: awake and sedated  Airway & Oxygen Therapy: Patient Spontanous Breathing and Patient connected to nasal cannula oxygen  Post-op Assessment: Report given to RN and Post -op Vital signs reviewed and stable  Post vital signs: Reviewed and stable  Last Vitals:  Filed Vitals:   06/23/15 0954 06/23/15 1053  BP: 130/85   Pulse: 84   Temp: 36.4 C 36.1 C  Resp: 16     Last Pain: There were no vitals filed for this visit.       Complications: No apparent anesthesia complications

## 2015-06-23 NOTE — Op Note (Signed)
[facility]astroenterology Patient Name: Caitlin Washington Procedure Date: 06/23/2015 10:25 AM MRN: [id number] Account #: [id number] Date of Birth: 09-24-1965 Admit Type: Outpatient Age: 50 Room: [facility] ENDO ROOM 2 Gender: Female Note Status: Finalized Procedure:            Colonoscopy Indications:          Colon cancer screening in patient at increased risk:                        Family history of 1st-degree relative with colon polyps Providers:            Manya Silvas, MD Referring MD:         Tracie Harrier, MD (Referring MD) Medicines:            Propofol per Anesthesia Complications:        No immediate complications. Procedure:            Pre-Anesthesia Assessment:                       - After reviewing the risks and benefits, the patient                        was deemed in satisfactory condition to undergo the                        procedure.                       After obtaining informed consent, the colonoscope was                        passed under direct vision. Throughout the procedure,                        the patient's blood pressure, pulse, and oxygen                        saturations were monitored continuously. The                        Colonoscope was introduced through the anus and                        advanced to the the cecum, identified by appendiceal                        orifice and ileocecal valve. The colonoscopy was                        performed without difficulty. The patient tolerated the                        procedure well. The quality of the bowel preparation                        was excellent. Findings:      Internal hemorrhoids were found during endoscopy. The hemorrhoids were       small and Grade I (internal hemorrhoids that do not prolapse).      The terminal ileum appeared normal.      The exam was  otherwise without abnormality. Impression:           - Internal hemorrhoids.                       -  The examined portion of the ileum was normal.                       - The examination was otherwise normal.                       - No specimens collected. Recommendation:       - Repeat colonoscopy in 5 years for screening purposes. Manya Silvas, MD 06/23/2015 10:52:17 AM This report has been signed electronically. Number of Addenda: 0 Note Initiated On: 06/23/2015 10:25 AM Scope Withdrawal Time: 0 hours 11 minutes 42 seconds  Total Procedure Duration: 0 hours 18 minutes 29 seconds       [facility]

## 2015-06-24 NOTE — Anesthesia Postprocedure Evaluation (Signed)
Anesthesia Post Note  Patient: Caitlin Washington  Procedure(s) Performed: Procedure(s) (LRB): COLONOSCOPY WITH PROPOFOL (N/A)  Patient location during evaluation: PACU Anesthesia Type: General Level of consciousness: awake and alert Pain management: pain level controlled Vital Signs Assessment: post-procedure vital signs reviewed and stable Respiratory status: spontaneous breathing, nonlabored ventilation, respiratory function stable and patient connected to nasal cannula oxygen Cardiovascular status: blood pressure returned to baseline and stable Postop Assessment: no signs of nausea or vomiting Anesthetic complications: no    Last Vitals:  Filed Vitals:   06/23/15 1113 06/23/15 1123  BP: 118/91 113/84  Pulse: 80 75  Temp:    Resp: 19 21    Last Pain: There were no vitals filed for this visit.               Molli Barrows

## 2015-07-29 DIAGNOSIS — M2011 Hallux valgus (acquired), right foot: Secondary | ICD-10-CM | POA: Diagnosis not present

## 2015-07-29 DIAGNOSIS — M79671 Pain in right foot: Secondary | ICD-10-CM | POA: Diagnosis not present

## 2015-09-02 ENCOUNTER — Encounter: Payer: Self-pay | Admitting: Physician Assistant

## 2015-09-02 ENCOUNTER — Ambulatory Visit: Payer: Self-pay | Admitting: Physician Assistant

## 2015-09-02 VITALS — BP 100/70 | HR 76

## 2015-09-02 DIAGNOSIS — M542 Cervicalgia: Secondary | ICD-10-CM

## 2015-09-02 MED ORDER — KETOROLAC TROMETHAMINE 30 MG/ML IJ SOLN
30.0000 mg | Freq: Once | INTRAMUSCULAR | Status: AC
  Administered 2015-09-02: 30 mg via INTRAMUSCULAR

## 2015-09-02 MED ORDER — METHOCARBAMOL 750 MG PO TABS: 750.0000 mg | ORAL_TABLET | Freq: Four times a day (QID) | ORAL | 0 refills | Status: DC

## 2015-09-02 MED ORDER — KETOROLAC TROMETHAMINE 10 MG PO TABS: 10.0000 mg | ORAL_TABLET | Freq: Four times a day (QID) | ORAL | 0 refills | Status: DC | PRN

## 2015-09-02 NOTE — Progress Notes (Signed)
   Subjective:Neck spasms    Patient ID: Caitlin Washington, female    DOB: 01-21-1966, 50 y.o.   MRN: [id number]  HPI Patient c/o neck pain/spasms for 3 days. Similar in nature to previous pain 2 years ago. Patient has history of cervical bulging disc. States no relief with Motrin. Denies radicular pain. Increase pain with lateral and flexion movement of neck. Awake e das ago with compliant.    Review of Systems    Anxiety, HTN, and Hypothyroidism Objective:   Physical Exam No obvious deformity of cervical spine. No guarding with palpation of spinal process. Decreased lateral and flexion of neck limited by compliant of pain. F/E ROM of upper extremities.       Assessment & Plan:Torticollis  Trail of Robaxin and Toradol.  Folllow up one week if no improvement or worsen of compliant.

## 2015-10-20 DIAGNOSIS — Z0000 Encounter for general adult medical examination without abnormal findings: Secondary | ICD-10-CM | POA: Diagnosis not present

## 2015-10-20 DIAGNOSIS — I493 Ventricular premature depolarization: Secondary | ICD-10-CM | POA: Diagnosis not present

## 2015-10-20 DIAGNOSIS — F411 Generalized anxiety disorder: Secondary | ICD-10-CM | POA: Diagnosis not present

## 2015-10-20 DIAGNOSIS — Z1239 Encounter for other screening for malignant neoplasm of breast: Secondary | ICD-10-CM | POA: Diagnosis not present

## 2015-10-20 DIAGNOSIS — M2011 Hallux valgus (acquired), right foot: Secondary | ICD-10-CM | POA: Diagnosis not present

## 2015-10-21 DIAGNOSIS — I493 Ventricular premature depolarization: Secondary | ICD-10-CM | POA: Diagnosis not present

## 2015-10-21 DIAGNOSIS — F411 Generalized anxiety disorder: Secondary | ICD-10-CM | POA: Diagnosis not present

## 2015-10-21 DIAGNOSIS — R002 Palpitations: Secondary | ICD-10-CM | POA: Diagnosis not present

## 2015-10-21 DIAGNOSIS — I10 Essential (primary) hypertension: Secondary | ICD-10-CM | POA: Diagnosis not present

## 2015-12-06 DIAGNOSIS — M2011 Hallux valgus (acquired), right foot: Secondary | ICD-10-CM | POA: Diagnosis not present

## 2015-12-07 ENCOUNTER — Encounter: Payer: Self-pay | Admitting: *Deleted

## 2015-12-09 NOTE — Discharge Instructions (Signed)
[facility] °[facility] ° °POST OPERATIVE INSTRUCTIONS FOR DR. TROXLER AND DR. FOWLER °[facility] ° ° °1. Take your medication as prescribed.  Pain medication should be taken only as needed. ° °2. Keep the dressing clean, dry and intact. ° °3. Keep your foot elevated above the heart level for the first 48 hours. ° °4. Walking to the bathroom and brief periods of walking are acceptable, unless we have instructed you to be non-weight bearing. ° °5. Always wear your post-op shoe when walking.  Always use your crutches if you are to be non-weight bearing. ° °6. Do not take a shower. Baths are permissible as long as the foot is kept out of the water.  ° °7. Every hour you are awake:  °- Bend your knee 15 times. °- Flex foot 15 times °- Massage calf 15 times ° °8. Call [facility] ([phone]) if any of the following problems occur: °- You develop a temperature or fever. °- The bandage becomes saturated with blood. °- Medication does not stop your pain. °- Injury of the foot occurs. °- Any symptoms of infection including redness, odor, or red streaks running from wound. ° ° °General Anesthesia, Adult, Care After °These instructions provide you with information about caring for yourself after your procedure. Your health care provider may also give you more specific instructions. Your treatment has been planned according to current medical practices, but problems sometimes occur. Call your health care provider if you have any problems or questions after your procedure. °What can I expect after the procedure? °After the procedure, it is common to have: °· Vomiting. °· A sore throat. °· Mental slowness. °It is common to feel: °· Nauseous. °· Cold or shivery. °· Sleepy. °· Tired. °· Sore or achy, even in parts of your body where you did not have surgery. °Follow these instructions at home: °For at least 24 hours after the procedure: °· Do not: °¨ Participate in  activities where you could fall or become injured. °¨ Drive. °¨ Use heavy machinery. °¨ Drink alcohol. °¨ Take sleeping pills or medicines that cause drowsiness. °¨ Make important decisions or sign legal documents. °¨ Take care of children on your own. °· Rest. °Eating and drinking °· If you vomit, drink water, juice, or soup when you can drink without vomiting. °· Drink enough fluid to keep your urine clear or pale yellow. °· Make sure you have little or no nausea before eating solid foods. °· Follow the diet recommended by your health care provider. °General instructions °· Have a responsible adult stay with you until you are awake and alert. °· Return to your normal activities as told by your health care provider. Ask your health care provider what activities are safe for you. °· Take over-the-counter and prescription medicines only as told by your health care provider. °· If you smoke, do not smoke without supervision. °· Keep all follow-up visits as told by your health care provider. This is important. °Contact a health care provider if: °· You continue to have nausea or vomiting at home, and medicines are not helpful. °· You cannot drink fluids or start eating again. °· You cannot urinate after 8-12 hours. °· You develop a skin rash. °· You have fever. °· You have increasing redness at the site of your procedure. °Get help right away if: °· You have difficulty breathing. °· You have chest pain. °· You have unexpected bleeding. °· You feel that you are having   a life-threatening or urgent problem. °This information is not intended to replace advice given to you by your health care provider. Make sure you discuss any questions you have with your health care provider. °Document Released: 04/16/2000 Document Revised: 06/13/2015 Document Reviewed: 12/23/2014 °Elsevier Interactive Patient Education © 2017 [employer] ° °

## 2015-12-12 ENCOUNTER — Other Ambulatory Visit: Payer: Self-pay | Admitting: Emergency Medicine

## 2015-12-14 ENCOUNTER — Ambulatory Visit
Admission: RE | Admit: 2015-12-14 | Discharge: 2015-12-14 | Disposition: A | Discharge status: Home / Self Care | Payer: 59 | Source: Ambulatory Visit | Attending: Podiatry | Admitting: Podiatry

## 2015-12-14 ENCOUNTER — Ambulatory Visit: Payer: 59 | Admitting: Anesthesiology

## 2015-12-14 ENCOUNTER — Encounter
Admission: RE | Disposition: A | Discharge status: Home / Self Care | Payer: Self-pay | Source: Ambulatory Visit | Attending: Podiatry

## 2015-12-14 DIAGNOSIS — M2012 Hallux valgus (acquired), left foot: Secondary | ICD-10-CM | POA: Diagnosis not present

## 2015-12-14 DIAGNOSIS — E039 Hypothyroidism, unspecified: Secondary | ICD-10-CM | POA: Diagnosis not present

## 2015-12-14 DIAGNOSIS — Z880 Allergy status to penicillin: Secondary | ICD-10-CM | POA: Insufficient documentation

## 2015-12-14 DIAGNOSIS — M2011 Hallux valgus (acquired), right foot: Secondary | ICD-10-CM | POA: Insufficient documentation

## 2015-12-14 DIAGNOSIS — M19071 Primary osteoarthritis, right ankle and foot: Secondary | ICD-10-CM | POA: Diagnosis not present

## 2015-12-14 DIAGNOSIS — I10 Essential (primary) hypertension: Secondary | ICD-10-CM | POA: Diagnosis not present

## 2015-12-14 DIAGNOSIS — Z882 Allergy status to sulfonamides status: Secondary | ICD-10-CM | POA: Insufficient documentation

## 2015-12-14 HISTORY — PX: HALLUX VALGUS AUSTIN: SHX6623

## 2015-12-14 HISTORY — DX: Presence of spectacles and contact lenses: Z97.3

## 2015-12-14 SURGERY — CORRECTION, HALLUX VALGUS
Anesthesia: Regional | Laterality: Right | Wound class: Clean

## 2015-12-14 MED ORDER — ROPIVACAINE HCL 5 MG/ML IJ SOLN
INTRAMUSCULAR | Status: DC | PRN
  Administered 2015-12-14: 35 mL via PERINEURAL

## 2015-12-14 MED ORDER — SCOPOLAMINE 1 MG/3DAYS TD PT72
1.0000 | MEDICATED_PATCH | Freq: Once | TRANSDERMAL | Status: DC
  Administered 2015-12-14: 1.5 mg via TRANSDERMAL

## 2015-12-14 MED ORDER — DEXAMETHASONE SODIUM PHOSPHATE 4 MG/ML IJ SOLN
INTRAMUSCULAR | Status: DC | PRN
  Administered 2015-12-14: 4 mg via PERINEURAL
  Administered 2015-12-14: 8 mg via INTRAVENOUS

## 2015-12-14 MED ORDER — BUPIVACAINE HCL (PF) 0.25 % IJ SOLN
INTRAMUSCULAR | Status: DC | PRN
  Administered 2015-12-14: 8 mL

## 2015-12-14 MED ORDER — LIDOCAINE HCL (PF) 1 % IJ SOLN
INTRAMUSCULAR | Status: DC | PRN
  Administered 2015-12-14: 9 mL

## 2015-12-14 MED ORDER — ONDANSETRON HCL 4 MG/2ML IJ SOLN
INTRAMUSCULAR | Status: DC | PRN
  Administered 2015-12-14: 4 mg via INTRAVENOUS

## 2015-12-14 MED ORDER — OXYCODONE-ACETAMINOPHEN 5-325 MG PO TABS: 1.0000 | ORAL_TABLET | ORAL | 0 refills | Status: DC | PRN

## 2015-12-14 MED ORDER — LACTATED RINGERS IV SOLN
INTRAVENOUS | Status: DC
  Administered 2015-12-14: 10:00:00 via INTRAVENOUS

## 2015-12-14 MED ORDER — PROPOFOL 500 MG/50ML IV EMUL
INTRAVENOUS | Status: DC | PRN
  Administered 2015-12-14: 75 ug/kg/min via INTRAVENOUS

## 2015-12-14 MED ORDER — LIDOCAINE HCL (CARDIAC) 20 MG/ML IV SOLN
INTRAVENOUS | Status: DC | PRN
  Administered 2015-12-14: 50 mg via INTRATRACHEAL

## 2015-12-14 MED ORDER — FENTANYL CITRATE (PF) 100 MCG/2ML IJ SOLN
INTRAMUSCULAR | Status: DC | PRN
  Administered 2015-12-14: 100 ug via INTRAVENOUS

## 2015-12-14 MED ORDER — MIDAZOLAM HCL 5 MG/5ML IJ SOLN
INTRAMUSCULAR | Status: DC | PRN
  Administered 2015-12-14: 2 mg via INTRAVENOUS

## 2015-12-14 MED ORDER — CLINDAMYCIN PHOSPHATE 600 MG/50ML IV SOLN
600.0000 mg | Freq: Once | INTRAVENOUS | Status: AC
  Administered 2015-12-14: 600 mg via INTRAVENOUS

## 2015-12-14 SURGICAL SUPPLY — 42 items
BANDAGE ELASTIC 4 VELCRO NS (GAUZE/BANDAGES/DRESSINGS) ×2 IMPLANT
BENZOIN TINCTURE PRP APPL 2/3 (GAUZE/BANDAGES/DRESSINGS) ×2 IMPLANT
BLADE MED AGGRESSIVE (BLADE) ×2 IMPLANT
BLADE OSC/SAGITTAL MD 5.5X18 (BLADE) IMPLANT
BLADE SURG 15 STRL LF DISP TIS (BLADE) IMPLANT
BLADE SURG 15 STRL SS (BLADE)
BNDG CMPR 75X41 PLY HI ABS (GAUZE/BANDAGES/DRESSINGS) ×1
BNDG ESMARK 4X12 TAN STRL LF (GAUZE/BANDAGES/DRESSINGS) ×2 IMPLANT
BNDG GAUZE 4.5X4.1 6PLY STRL (MISCELLANEOUS) ×2 IMPLANT
BNDG STRETCH 4X75 STRL LF (GAUZE/BANDAGES/DRESSINGS) ×2 IMPLANT
CANISTER SUCT 1200ML W/VALVE (MISCELLANEOUS) ×2 IMPLANT
COVER LIGHT HANDLE UNIVERSAL (MISCELLANEOUS) ×4 IMPLANT
CUFF TOURN SGL QUICK 18 (TOURNIQUET CUFF) IMPLANT
DRAPE FLUOR MINI C-ARM 54X84 (DRAPES) ×2 IMPLANT
DURAPREP 26ML APPLICATOR (WOUND CARE) ×2 IMPLANT
GAUZE PETRO XEROFOAM 1X8 (MISCELLANEOUS) ×2 IMPLANT
GAUZE SPONGE 4X4 12PLY STRL (GAUZE/BANDAGES/DRESSINGS) ×2 IMPLANT
GLOVE BIO SURGEON STRL SZ7.5 (GLOVE) ×2 IMPLANT
GLOVE INDICATOR 8.0 STRL GRN (GLOVE) ×2 IMPLANT
GOWN STRL REUS W/ TWL LRG LVL3 (GOWN DISPOSABLE) ×2 IMPLANT
GOWN STRL REUS W/TWL LRG LVL3 (GOWN DISPOSABLE) ×2
K-WIRE DBL END TROCAR 6X.045 (WIRE) ×2
K-WIRE DBL END TROCAR 6X.062 (WIRE) ×2
KIT ROOM TURNOVER OR (KITS) ×2 IMPLANT
KWIRE DBL END TROCAR 6X.045 (WIRE) ×1 IMPLANT
KWIRE DBL END TROCAR 6X.062 (WIRE) ×1 IMPLANT
NS IRRIG 500ML POUR BTL (IV SOLUTION) ×2 IMPLANT
PACK EXTREMITY ARMC (MISCELLANEOUS) ×2 IMPLANT
PAD GROUND ADULT SPLIT (MISCELLANEOUS) ×2 IMPLANT
PIN BALLS 3/8 F/.045 WIRE (MISCELLANEOUS) ×2 IMPLANT
RASP SM TEAR CROSS CUT (RASP) ×2 IMPLANT
STOCKINETTE STRL 6IN 960660 (GAUZE/BANDAGES/DRESSINGS) ×2 IMPLANT
STRAP BODY AND KNEE 60X3 (MISCELLANEOUS) ×2 IMPLANT
STRIP CLOSURE SKIN 1/4X4 (GAUZE/BANDAGES/DRESSINGS) ×2 IMPLANT
SUT MNCRL 5-0+ PC-1 (SUTURE) ×1 IMPLANT
SUT MONOCRYL 5-0 (SUTURE) ×1
SUT VIC AB 2-0 SH 27 (SUTURE)
SUT VIC AB 2-0 SH 27XBRD (SUTURE) IMPLANT
SUT VIC AB 3-0 SH 27 (SUTURE) ×1
SUT VIC AB 3-0 SH 27X BRD (SUTURE) ×1 IMPLANT
SUT VIC AB 4-0 FS2 27 (SUTURE) ×2 IMPLANT
k-wire ×2 IMPLANT

## 2015-12-14 NOTE — H&P (Signed)
HISTORY AND PHYSICAL INTERVAL NOTE:  12/14/2015  11:07 AM  Caitlin Washington  has presented today for surgery, with the diagnosis of M20.12 HALLUX VALGUS RIGHT.  The various methods of treatment have been discussed with the patient.  No guarantees were given.  After consideration of risks, benefits and other options for treatment, the patient has consented to surgery.  I have reviewed the patients' chart and labs.    Patient Vitals for the past 24 hrs:  BP Temp Temp src Pulse Resp SpO2 Weight  12/14/15 1034 - - - 65 13 100 % -  12/14/15 1032 (!) 107/53 - - 65 (!) 9 100 % -  12/14/15 1027 120/65 - - 71 14 100 % -  12/14/15 1025 - - - 65 - 100 % -  12/14/15 0957 (!) 109/50 97.5 F (36.4 C) Temporal 67 - 100 % 63 kg (139 lb)    A history and physical examination was performed in my office.  The patient was reexamined.  There have been no changes to this history and physical examination.  [name] A

## 2015-12-14 NOTE — Anesthesia Procedure Notes (Signed)
Anesthesia Regional Block:  Popliteal block  Pre-Anesthetic Checklist: ,, timeout performed, Correct Patient, Correct Site, Correct Laterality, Correct Procedure, Correct Position, site marked, Risks and benefits discussed,  Surgical consent,  Pre-op evaluation,  At surgeon's request and post-op pain management  Laterality: Right  Prep: chloraprep       Needles:  Injection technique: Single-shot  Needle Type: Stimiplex     Needle Length: 9cm 9 cm Needle Gauge: 21 and 21 G    Additional Needles:  Procedures: ultrasound guided (picture in chart) Popliteal block Narrative:  Start time: 12/14/2015 10:25 AM End time: 12/14/2015 10:33 AM Injection made incrementally with aspirations every 5 mL.  Performed by: Personally  Anesthesiologist: Ronelle Nigh  Additional Notes: Functioning IV was confirmed and monitors applied. Ultrasound guidance: relevant anatomy identified, needle position confirmed, local anesthetic spread visualized around nerve(s)., vascular puncture avoided.  Image printed for medical record.  Negative aspiration and no paresthesias; incremental administration of local anesthetic. The patient tolerated the procedure well. Vitals signes recorded in RN notes. 25 ml to popliteal, 10 ml to adductor canal.

## 2015-12-14 NOTE — Transfer of Care (Signed)
Immediate Anesthesia Transfer of Care Note  Patient: Caitlin Washington  Procedure(s) Performed: Procedure(s) with comments: HALLUX VALGUS [place]USTIN RIGHT FOOT (Right) - POPLITEAL  Patient Location: PACU  Anesthesia Type: General LMA, Regional  Level of Consciousness: awake, alert  and patient cooperative  Airway and Oxygen Therapy: Patient Spontanous Breathing and Patient connected to supplemental oxygen  Post-op Assessment: Post-op Vital signs reviewed, Patient's Cardiovascular Status Stable, Respiratory Function Stable, Patent Airway and No signs of Nausea or vomiting  Post-op Vital Signs: Reviewed and stable  Complications: No apparent anesthesia complications

## 2015-12-14 NOTE — Anesthesia Preprocedure Evaluation (Addendum)
Anesthesia Evaluation  Patient identified by MRN, date of birth, ID band Patient awake    Reviewed: Allergy & Precautions, H&P , NPO status , Patient's Chart, lab work & pertinent test results  History of Anesthesia Complications (+) PONV and history of anesthetic complications  Airway Mallampati: II  TM Distance: >3 FB Neck ROM: full    Dental no notable dental hx.    Pulmonary    Pulmonary exam normal        Cardiovascular hypertension, Normal cardiovascular exam     Neuro/Psych PSYCHIATRIC DISORDERS  Neuromuscular disease    GI/Hepatic   Endo/Other  Hypothyroidism   Renal/GU      Musculoskeletal   Abdominal   Peds  Hematology   Anesthesia Other Findings   Reproductive/Obstetrics                             Anesthesia Physical Anesthesia Plan  ASA: II  Anesthesia Plan: Regional and MAC   Post-op Pain Management:    Induction:   Airway Management Planned:   Additional Equipment:   Intra-op Plan:   Post-operative Plan:   Informed Consent: I have reviewed the patients History and Physical, chart, labs and discussed the procedure including the risks, benefits and alternatives for the proposed anesthesia with the patient or authorized representative who has indicated his/her understanding and acceptance.     Plan Discussed with:   Anesthesia Plan Comments:        Anesthesia Quick Evaluation

## 2015-12-14 NOTE — Op Note (Signed)
Operative note   Surgeon:Morganna Styles Lawyer: None    Preop diagnosis: Right foot hallux valgus    Postop diagnosis: Same    Procedure: [place] hallux valgus correction right foot    EBL: Minimal    Anesthesia:local and regional    Hemostasis: Ankle tourniquet inflated to 200 mmHg for approximately 40 minutes    Specimen: None    Complications: None    Operative indications:Pang D [name] is an 50 y.o. that presents today for surgical intervention.  The risks/benefits/alternatives/complications have been discussed and consent has been given.    Procedure:  Patient was brought into the OR and placed on the operating table in thesupine position. After anesthesia was obtained theright lower extremity was prepped and draped in usual sterile fashion.  Attention was directed to the dorsal medial first MTPJ where an incision was performed. Sharp and blunt dissection carried down to the capsule. Next a T capsulotomy was performed. The dorsomedial eminence was noted and transected. The joint was evaluated. Minimal osteoarthritic changes were noted with good articular cartilage. Next a V osteotomy was created. The capital fragment was translocated laterally. This was then stabilized with a 0.062 K wire. The ensuing overhanging ledge was transected with a power saw. All areas were smoothed with a power rasp. The wound was flushed with copious amounts or irrigation. Layered closure was performed with a small capsulorrhaphy medially. 3-0 Vicryl for the deeper layer, 4-0 Vicryl for the subcutaneous tissue and a 5-0 Monocryl undyed for skin was used. 0.25% Marcaine was used to straight all areas. A bulky sterile dressing was then applied.   Patient tolerated the procedure and anesthesia well.  Was transported from the OR to the PACU with all vital signs stable and vascular status intact. To be discharged per routine protocol.  Will follow up in approximately 1 week in the outpatient  clinic.

## 2015-12-14 NOTE — Anesthesia Procedure Notes (Signed)
Procedure Name: MAC Date/Time: 12/14/2015 11:27 AM Performed by: Londell Moh Pre-anesthesia Checklist: Patient identified, Emergency Drugs available, Suction available, Timeout performed and Patient being monitored Patient Re-evaluated:Patient Re-evaluated prior to inductionOxygen Delivery Method: Simple face mask Placement Confirmation: positive ETCO2

## 2015-12-14 NOTE — Anesthesia Postprocedure Evaluation (Signed)
Anesthesia Post Note  Patient: Caitlin Washington  Procedure(s) Performed: Procedure(s) (LRB): HALLUX VALGUS AUSTIN RIGHT FOOT (Right)  Patient location during evaluation: PACU Anesthesia Type: MAC and Regional Level of consciousness: awake and alert and oriented Pain management: satisfactory to patient Vital Signs Assessment: post-procedure vital signs reviewed and stable Respiratory status: spontaneous breathing, nonlabored ventilation and respiratory function stable Cardiovascular status: blood pressure returned to baseline and stable Postop Assessment: Adequate PO intake and No signs of nausea or vomiting Anesthetic complications: no    Raliegh Ip

## 2015-12-19 DIAGNOSIS — R400 Somnolence: Secondary | ICD-10-CM | POA: Diagnosis not present

## 2015-12-19 DIAGNOSIS — I493 Ventricular premature depolarization: Secondary | ICD-10-CM | POA: Diagnosis not present

## 2015-12-20 DIAGNOSIS — M2011 Hallux valgus (acquired), right foot: Secondary | ICD-10-CM | POA: Diagnosis not present

## 2015-12-21 ENCOUNTER — Other Ambulatory Visit: Payer: Self-pay | Admitting: Obstetrics and Gynecology

## 2015-12-21 DIAGNOSIS — Z1231 Encounter for screening mammogram for malignant neoplasm of breast: Secondary | ICD-10-CM

## 2015-12-23 ENCOUNTER — Ambulatory Visit
Admission: RE | Admit: 2015-12-23 | Discharge: 2015-12-23 | Disposition: A | Discharge status: Home / Self Care | Payer: 59 | Source: Ambulatory Visit | Attending: Obstetrics and Gynecology | Admitting: Obstetrics and Gynecology

## 2015-12-23 DIAGNOSIS — Z1231 Encounter for screening mammogram for malignant neoplasm of breast: Secondary | ICD-10-CM | POA: Insufficient documentation

## 2015-12-28 ENCOUNTER — Encounter: Payer: Self-pay | Admitting: Physician Assistant

## 2015-12-28 ENCOUNTER — Ambulatory Visit: Payer: Self-pay | Admitting: Physician Assistant

## 2015-12-28 VITALS — BP 130/82 | HR 96 | Temp 98.3°F

## 2015-12-28 DIAGNOSIS — J069 Acute upper respiratory infection, unspecified: Secondary | ICD-10-CM

## 2015-12-28 MED ORDER — PREDNISONE 10 MG PO TABS: 30.0000 mg | ORAL_TABLET | Freq: Every day | ORAL | 0 refills | Status: DC

## 2015-12-28 MED ORDER — AZITHROMYCIN 250 MG PO TABS: ORAL_TABLET | ORAL | 0 refills | Status: DC

## 2015-12-28 NOTE — Progress Notes (Signed)
S: C/o runny nose and congestion with dry cough for 3 days, + fever, chills, denies cp/sob, v/d; mucus was green this am but clear throughout the day, cough is sporadic, ears hurt  Using otc meds: robitussin, sudafed  O: PE: vitals wnl, nad,  perrl eomi, normocephalic, tms dull pink b/l, nasal mucosa red and swollen, throat injected, neck supple no lymph, lungs c t a, cv rrr, neuro intact,   A:  Acute uri   P: drink fluids, continue regular meds , use otc meds of choice, return if not improving in 5 days, return earlier if worsening , zpack, prednisone 30mg  qd x 3d

## 2016-01-03 ENCOUNTER — Encounter: Payer: Self-pay | Admitting: Physician Assistant

## 2016-01-03 ENCOUNTER — Ambulatory Visit: Payer: Self-pay | Admitting: Physician Assistant

## 2016-01-03 VITALS — BP 110/80 | HR 80 | Temp 97.7°F

## 2016-01-03 DIAGNOSIS — J0100 Acute maxillary sinusitis, unspecified: Secondary | ICD-10-CM

## 2016-01-03 NOTE — Progress Notes (Signed)
S: here for recheck, finished zpack yesterday, states she is still getting bloody discharge from her nose and her ears are clogged up, no fever/chills, does feel a lot better than she did  O: vitals wnl, nad, tms dull and pink, nasal mucosa still red, throat wnl, neck supple no lymph, lungs c t a, cv rrr  A: acute sinusitis  P: zpack is still in her system for 5 days after finishing medication, reassurance given, if not better by Thursday will call in another antibiotic and 3d prednisone

## 2016-01-18 ENCOUNTER — Ambulatory Visit: Payer: 59 | Attending: Internal Medicine

## 2016-01-18 DIAGNOSIS — G4733 Obstructive sleep apnea (adult) (pediatric): Secondary | ICD-10-CM | POA: Insufficient documentation

## 2016-01-18 DIAGNOSIS — G4730 Sleep apnea, unspecified: Secondary | ICD-10-CM | POA: Diagnosis not present

## 2016-01-24 DIAGNOSIS — M2011 Hallux valgus (acquired), right foot: Secondary | ICD-10-CM | POA: Diagnosis not present

## 2016-01-26 ENCOUNTER — Ambulatory Visit: Payer: 59 | Attending: Internal Medicine

## 2016-01-26 DIAGNOSIS — G4733 Obstructive sleep apnea (adult) (pediatric): Secondary | ICD-10-CM | POA: Insufficient documentation

## 2016-02-07 DIAGNOSIS — G4733 Obstructive sleep apnea (adult) (pediatric): Secondary | ICD-10-CM | POA: Diagnosis not present

## 2016-03-09 DIAGNOSIS — G4733 Obstructive sleep apnea (adult) (pediatric): Secondary | ICD-10-CM | POA: Diagnosis not present

## 2016-03-12 DIAGNOSIS — D1801 Hemangioma of skin and subcutaneous tissue: Secondary | ICD-10-CM | POA: Diagnosis not present

## 2016-03-12 DIAGNOSIS — L239 Allergic contact dermatitis, unspecified cause: Secondary | ICD-10-CM | POA: Diagnosis not present

## 2016-03-12 DIAGNOSIS — D2372 Other benign neoplasm of skin of left lower limb, including hip: Secondary | ICD-10-CM | POA: Diagnosis not present

## 2016-03-12 DIAGNOSIS — L821 Other seborrheic keratosis: Secondary | ICD-10-CM | POA: Diagnosis not present

## 2016-03-24 DIAGNOSIS — R5383 Other fatigue: Secondary | ICD-10-CM | POA: Diagnosis not present

## 2016-03-24 DIAGNOSIS — R5381 Other malaise: Secondary | ICD-10-CM | POA: Diagnosis not present

## 2016-03-24 DIAGNOSIS — E039 Hypothyroidism, unspecified: Secondary | ICD-10-CM | POA: Diagnosis not present

## 2016-03-24 DIAGNOSIS — M79602 Pain in left arm: Secondary | ICD-10-CM | POA: Diagnosis not present

## 2016-03-24 DIAGNOSIS — M79601 Pain in right arm: Secondary | ICD-10-CM | POA: Diagnosis not present

## 2016-03-24 DIAGNOSIS — R202 Paresthesia of skin: Secondary | ICD-10-CM | POA: Diagnosis not present

## 2016-03-24 DIAGNOSIS — G4733 Obstructive sleep apnea (adult) (pediatric): Secondary | ICD-10-CM | POA: Diagnosis not present

## 2016-03-28 ENCOUNTER — Other Ambulatory Visit: Payer: Self-pay | Admitting: Unknown Physician Specialty

## 2016-03-28 DIAGNOSIS — G4733 Obstructive sleep apnea (adult) (pediatric): Secondary | ICD-10-CM | POA: Diagnosis not present

## 2016-03-28 DIAGNOSIS — J329 Chronic sinusitis, unspecified: Secondary | ICD-10-CM

## 2016-03-28 DIAGNOSIS — J340 Abscess, furuncle and carbuncle of nose: Secondary | ICD-10-CM | POA: Diagnosis not present

## 2016-03-28 DIAGNOSIS — R51 Headache: Secondary | ICD-10-CM | POA: Diagnosis not present

## 2016-03-29 ENCOUNTER — Ambulatory Visit
Admission: RE | Admit: 2016-03-29 | Discharge: 2016-03-29 | Disposition: A | Discharge status: Home / Self Care | Payer: 59 | Source: Ambulatory Visit | Attending: Unknown Physician Specialty | Admitting: Unknown Physician Specialty

## 2016-03-29 DIAGNOSIS — J342 Deviated nasal septum: Secondary | ICD-10-CM | POA: Diagnosis not present

## 2016-03-29 DIAGNOSIS — J329 Chronic sinusitis, unspecified: Secondary | ICD-10-CM | POA: Diagnosis not present

## 2016-04-04 DIAGNOSIS — G4733 Obstructive sleep apnea (adult) (pediatric): Secondary | ICD-10-CM | POA: Diagnosis not present

## 2016-04-06 DIAGNOSIS — G4733 Obstructive sleep apnea (adult) (pediatric): Secondary | ICD-10-CM | POA: Diagnosis not present

## 2016-04-10 ENCOUNTER — Encounter: Payer: 59 | Admitting: Obstetrics and Gynecology

## 2016-04-27 DIAGNOSIS — R002 Palpitations: Secondary | ICD-10-CM | POA: Diagnosis not present

## 2016-04-27 DIAGNOSIS — I493 Ventricular premature depolarization: Secondary | ICD-10-CM | POA: Diagnosis not present

## 2016-04-27 DIAGNOSIS — I10 Essential (primary) hypertension: Secondary | ICD-10-CM | POA: Diagnosis not present

## 2016-04-27 DIAGNOSIS — F411 Generalized anxiety disorder: Secondary | ICD-10-CM | POA: Diagnosis not present

## 2016-05-02 DIAGNOSIS — G4733 Obstructive sleep apnea (adult) (pediatric): Secondary | ICD-10-CM | POA: Diagnosis not present

## 2016-05-02 DIAGNOSIS — I493 Ventricular premature depolarization: Secondary | ICD-10-CM | POA: Diagnosis not present

## 2016-05-02 DIAGNOSIS — Z0000 Encounter for general adult medical examination without abnormal findings: Secondary | ICD-10-CM | POA: Diagnosis not present

## 2016-05-02 DIAGNOSIS — F411 Generalized anxiety disorder: Secondary | ICD-10-CM | POA: Diagnosis not present

## 2016-05-07 DIAGNOSIS — G4733 Obstructive sleep apnea (adult) (pediatric): Secondary | ICD-10-CM | POA: Diagnosis not present

## 2016-05-14 DIAGNOSIS — G4733 Obstructive sleep apnea (adult) (pediatric): Secondary | ICD-10-CM | POA: Diagnosis not present

## 2016-05-15 DIAGNOSIS — H5213 Myopia, bilateral: Secondary | ICD-10-CM | POA: Diagnosis not present

## 2016-05-24 ENCOUNTER — Other Ambulatory Visit: Payer: Self-pay | Admitting: Obstetrics and Gynecology

## 2016-06-07 DIAGNOSIS — H61009 Unspecified perichondritis of external ear, unspecified ear: Secondary | ICD-10-CM | POA: Diagnosis not present

## 2016-06-07 DIAGNOSIS — L309 Dermatitis, unspecified: Secondary | ICD-10-CM | POA: Diagnosis not present

## 2016-07-05 ENCOUNTER — Other Ambulatory Visit: Payer: Self-pay | Admitting: Obstetrics and Gynecology

## 2016-07-18 ENCOUNTER — Ambulatory Visit (INDEPENDENT_AMBULATORY_CARE_PROVIDER_SITE_OTHER): Payer: 59 | Admitting: Obstetrics and Gynecology

## 2016-07-18 ENCOUNTER — Encounter: Payer: Self-pay | Admitting: Obstetrics and Gynecology

## 2016-07-18 VITALS — BP 110/57 | HR 76 | Ht 67.0 in | Wt 142.0 lb

## 2016-07-18 DIAGNOSIS — Z01419 Encounter for gynecological examination (general) (routine) without abnormal findings: Secondary | ICD-10-CM

## 2016-07-18 MED ORDER — ESTRADIOL 0.0375 MG/24HR TD PTTW: MEDICATED_PATCH | TRANSDERMAL | 6 refills | Status: DC

## 2016-07-18 MED ORDER — FLUTICASONE PROPIONATE 50 MCG/ACT NA SUSP: NASAL | 6 refills | Status: DC

## 2016-07-18 MED ORDER — PROGESTERONE MICRONIZED 100 MG PO CAPS: ORAL_CAPSULE | ORAL | 4 refills | Status: DC

## 2016-07-18 NOTE — Progress Notes (Signed)
Subjective:   Caitlin Washington is a 51 y.o. No obstetric history on file. Caucasian female here for a routine well-woman exam.  No LMP recorded. Patient has had a hysterectomy.    Current complaints: none PCP: hande       done desire labs  Social History: Sexual: heterosexual Marital Status: married Living situation: with father Occupation: admission coordinator at [employer] Tobacco/alcohol: no tobacco use Illicit drugs: no history of illicit drug use  The following portions of the patient's history were reviewed and updated as appropriate: allergies, current medications, past family history, past medical history, past social history, past surgical history and problem list.  Past Medical History Past Medical History:  Diagnosis Date  . Anemia   . Anxiety   . Dyspareunia in female   . Hypertension   . Hypothyroidism   . PONV (postoperative nausea and vomiting)    after hysterectomy  . PVC (premature ventricular contraction)    controlled with metoprolol  . Sweating abnormality   . Thyroid disease   . Wears contact lenses     Past Surgical History Past Surgical History:  Procedure Laterality Date  . ABDOMINAL HYSTERECTOMY    . COLONOSCOPY WITH PROPOFOL N/A 06/23/2015   Procedure: COLONOSCOPY WITH PROPOFOL;  Surgeon: Manya Silvas, MD;  [facility] END[facility]COPY;  Service: Endoscopy;  Laterality: N/A;  . HALLUX VALGUS AUSTIN Right 12/14/2015   Procedure: HALLUX VALGUS AUSTIN RIGHT FOOT;  Surgeon: [name], DPM;  Location: [facility];  Service: Podiatry;  Laterality: Right;  POPLITEAL  . TONSILLECTOMY    . TUBAL LIGATION      Gynecologic History No obstetric history on file.  No LMP recorded. Patient has had a hysterectomy. Contraception: post menopausal status Last Pap: 2017. Results were: normal Last mammogram: 12/2015. Results were: normal  Obstetric History OB History  No data available    Current Medications Current Outpatient Prescriptions  on File Prior to Visit  Medication Sig Dispense Refill  . ALPRAZolam (XANAX) 0.25 MG tablet TAKE 1 TABLET BY MOUTH TWICE A DAY AS NEEDED FOR ANXIETY 60 tablet 2  . Calcium Carbonate-Vitamin D 600-400 MG-UNIT tablet Take by mouth. Reported on 04/07/2015    . citalopram (CELEXA) 10 MG tablet TAKE 1 TABLET BY MOUTH ONCE DAILY (Patient taking differently: two tablets daily) 90 tablet 3  . cyanocobalamin (,VITAMIN B-12,) 1000 MCG/ML injection Inject 1 mL (1,000 mcg total) into the muscle once. 1 mL 0  . estradiol (VIVELLE-DOT) 0.0375 MG/24HR PLACE 1 PATCH TRANSDERMAL TWO TIMES A WEEK 24 patch 6  . fluticasone (FLONASE) 50 MCG/ACT nasal spray USE TWO SPRAYS IN EACH NOSTRIL ONCE DAILY 16 g 6  . progesterone (PROMETRIUM) 100 MG capsule TAKE 1 CAPSULE BY MOUTH NIGHTLY AT BEDTIME 90 capsule 4  . ferrous sulfate 325 (65 FE) MG tablet Take 325 mg by mouth 2 (two) times daily with a meal.    . levothyroxine (SYNTHROID, LEVOTHROID) 88 MCG tablet Take 1.5 tablets (132 mcg total) by mouth daily before breakfast. 30 tablet 6  . vitamin E 400 UNIT capsule Take by mouth. Reported on 04/07/2015     No current facility-administered medications on file prior to visit.     Review of Systems Patient denies any headaches, blurred vision, shortness of breath, chest pain, abdominal pain, problems with bowel movements, urination, or intercourse.  Objective:  BP (!) 110/57   Pulse 76   Ht 5\' 7"  (1.702 m)   Wt 142 lb (64.4 kg)   BMI 22.24 kg/m  Physical Exam  General:  Well developed, well nourished, no acute distress. She is alert and oriented x3. Skin:  Warm and dry Neck:  Midline trachea, no thyromegaly or nodules Cardiovascular: Regular rate and rhythm, no murmur heard Lungs:  Effort normal, all lung fields clear to auscultation bilaterally Breasts:  No dominant palpable mass, retraction, or nipple discharge Abdomen:  Soft, non tender, no hepatosplenomegaly or masses Pelvic:  External genitalia is normal in  appearance.  The vagina is normal in appearance. The cervix is bulbous, no CMT.  Thin prep pap is not done . Uterus is surgically absent.  No adnexal masses or tenderness noted. Extremities:  No swelling or varicosities noted Psych:  She has a normal mood and affect  Assessment:   Healthy well-woman exam  Plan:  Medications refilled F/U 1 year for Ae, or sooner if needed Mammogram ordered or sooner if problems Colonoscopy done  Melody Rockney Ghee, CNM

## 2016-08-14 DIAGNOSIS — G4733 Obstructive sleep apnea (adult) (pediatric): Secondary | ICD-10-CM | POA: Diagnosis not present

## 2016-10-05 DIAGNOSIS — G4733 Obstructive sleep apnea (adult) (pediatric): Secondary | ICD-10-CM | POA: Diagnosis not present

## 2016-10-08 ENCOUNTER — Encounter: Payer: Self-pay | Admitting: Physician Assistant

## 2016-10-08 ENCOUNTER — Ambulatory Visit: Payer: Self-pay | Admitting: Physician Assistant

## 2016-10-08 VITALS — BP 114/80 | HR 74 | Temp 97.7°F

## 2016-10-08 DIAGNOSIS — L237 Allergic contact dermatitis due to plants, except food: Secondary | ICD-10-CM

## 2016-10-08 MED ORDER — PREDNISONE 10 MG (48) PO TBPK: ORAL_TABLET | ORAL | 0 refills | Status: DC

## 2016-10-08 MED ORDER — DEXAMETHASONE SODIUM PHOSPHATE 10 MG/ML IJ SOLN
10.0000 mg | Freq: Once | INTRAMUSCULAR | Status: AC
  Administered 2016-10-08: 10 mg via INTRAMUSCULAR

## 2016-10-08 NOTE — Progress Notes (Signed)
S: c/o itchy rash on abd,  arms, legs, thinks the dog was outside in yard,   broke out after he jumped on her, sx for few days, tried multiple otc meds without relief, denies fever/chills  O: vitals wnl, nad, lungs c t a, cv rrr, skin with small raised red areas some with streaks/blisters, no drainage, n/v intact  A: acute contact dermatitis  P: sterapred ds 10mg  12d dose pack, decadron 10mg  IM given in clinic, pt had swelling on arm where it started to bruise, instructed to put ice on the area, return if worsening, states she feels fine

## 2016-10-21 ENCOUNTER — Encounter: Payer: Self-pay | Admitting: Gynecology

## 2016-10-21 ENCOUNTER — Ambulatory Visit
Admission: EM | Admit: 2016-10-21 | Discharge: 2016-10-21 | Disposition: A | Discharge status: Home / Self Care | Payer: 59 | Attending: Family Medicine | Admitting: Family Medicine

## 2016-10-21 DIAGNOSIS — J029 Acute pharyngitis, unspecified: Secondary | ICD-10-CM | POA: Insufficient documentation

## 2016-10-21 DIAGNOSIS — B370 Candidal stomatitis: Secondary | ICD-10-CM | POA: Diagnosis not present

## 2016-10-21 DIAGNOSIS — Z131 Encounter for screening for diabetes mellitus: Secondary | ICD-10-CM

## 2016-10-21 DIAGNOSIS — B3781 Candidal esophagitis: Secondary | ICD-10-CM | POA: Insufficient documentation

## 2016-10-21 LAB — GLUCOSE, CAPILLARY: Glucose-Capillary: 88 mg/dL (ref 65–99)

## 2016-10-21 LAB — RAPID STREP SCREEN (MED CTR MEBANE ONLY): Streptococcus, Group A Screen (Direct): NEGATIVE

## 2016-10-21 MED ORDER — CLOTRIMAZOLE 10 MG MT TROC: 10.0000 mg | Freq: Every day | OROMUCOSAL | 0 refills | Status: DC

## 2016-10-21 NOTE — ED Provider Notes (Signed)
[facility]    CSN: [id number] Arrival date & time: 10/21/16  0820     History   Chief Complaint Chief Complaint  Patient presents with  . Thrush    HPI Caitlin Washington is a 51 y.o. female.   Patient is 51 year old white female who states that earlier in September she developed a rash on her left torso. Initially she thought was shingles but because of spread over other parts of her body she went to employee health at home and was diagnosed with having poison O/poison ivy. She was given a dose of IM prednisone and then placed on a 12 day course of prednisone taper. That period to be effective in taking care of the thrush however she developed irritation in the throat and mouth over the last 3-4 days. She's been gargling with salt water she reports all over her mouth feels raw she saw some white lesions in her mouth and along her lower lips. She thinks she is thrush involving if prednisone could've caused this. Past smoking history hysterectomy hypertension history PVCs thyroid disease anemia and anxiety. For the hysterectomy she had a tubal ligation and she is also had tonsillectomy and bunion surgery as well. Son has severe seizure disorder. She is allergic to sulfur and penicillins and amoxicillin and she never smoked. 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   The history is provided by the patient. No language interpreter was used.  Sore Throat  This is a new problem. The current episode started more than 2 days ago. The problem occurs constantly. The problem has been gradually worsening. Pertinent negatives include no chest pain, no abdominal pain, no headaches and no shortness of breath. Nothing aggravates the symptoms. Nothing relieves the symptoms. She has tried nothing for the symptoms. The treatment provided no relief.    Past Medical History:  Diagnosis Date  . Anemia   . Anxiety   . Dyspareunia in female   . Hypertension   . Hypothyroidism   . PONV  (postoperative nausea and vomiting)    after hysterectomy  . PVC (premature ventricular contraction)    controlled with metoprolol  . Sweating abnormality   . Thyroid disease   . Wears contact lenses     Patient Active Problem List   Diagnosis Date Noted  . Beat, premature ventricular 10/22/2014  . Anxiety, generalized 09/28/2014  . Anxiety state 03/24/2014  . Cervical radiculitis 08/17/2013  . Degeneration of intervertebral disc of cervical region 08/17/2013    Past Surgical History:  Procedure Laterality Date  . ABDOMINAL HYSTERECTOMY    . COLONOSCOPY WITH PROPOFOL N/A 06/23/2015   Procedure: COLONOSCOPY WITH PROPOFOL;  Surgeon: Manya Silvas, MD;  Location: [facility] ENDOSCOPY;  Service: Endoscopy;  Laterality: N/A;  . FOOT SURGERY Right    buion excision  . HALLUX VALGUS AUSTIN Right 12/14/2015   Procedure: HALLUX VALGUS AUSTIN RIGHT FOOT;  Surgeon: [name], DPM;  Location: [place];  Service: Podiatry;  Laterality: Right;  POPLITEAL  . TONSILLECTOMY    . TUBAL LIGATION      OB History    No data available       Home Medications    Prior to Admission medications   Medication Sig Start Date End Date Taking? Authorizing Provider  ALPRAZolam (XANAX) 0.25 MG tablet TAKE 1 TABLET BY MOUTH TWICE A DAY AS NEEDED FOR ANXIETY 07/05/16  Yes Shambley, Melody N, CNM  Calcium Carbonate-Vitamin D 600-400 MG-UNIT tablet Take by mouth. Reported on  04/07/2015   Yes [provider]  citalopram (CELEXA) 10 MG tablet TAKE 1 TABLET BY MOUTH ONCE DAILY Patient taking differently: two tablets daily 06/07/15  Yes Shambley, Melody N, CNM  cyanocobalamin (,VITAMIN B-12,) 1000 MCG/ML injection Inject 1 mL (1,000 mcg total) into the muscle once. 03/25/15  Yes Sable Feil, PA-C  estradiol (VIVELLE-DOT) 0.0375 MG/24HR PLACE 1 PATCH TRANSDERMAL TWO TIMES A WEEK 07/18/16  Yes Shambley, Melody N, CNM  ferrous sulfate 325 (65 FE) MG tablet Take 325 mg by mouth 2 (two) times daily  with a meal.   Yes [provider]  fluticasone (FLONASE) 50 MCG/ACT nasal spray USE TWO SPRAYS IN EACH NOSTRIL ONCE DAILY 07/18/16  Yes Shambley, Melody N, CNM  progesterone (PROMETRIUM) 100 MG capsule TAKE 1 CAPSULE BY MOUTH NIGHTLY AT BEDTIME 07/18/16  Yes Shambley, Melody N, CNM  levothyroxine (SYNTHROID, LEVOTHROID) 88 MCG tablet Take 1.5 tablets (132 mcg total) by mouth daily before breakfast. 04/07/15 04/06/16  Shambley, Melody N, CNM  predniSONE (STERAPRED UNI-PAK 48 TAB) 10 MG (48) TBPK tablet Use as directed 10/08/16   Versie Starks, PA-C  vitamin E 400 UNIT capsule Take by mouth. Reported on 04/07/2015    [provider]    Family History Family History  Problem Relation Age of Onset  . Hypertension Father   . Heart disease Maternal Grandmother   . Heart disease Paternal Grandfather   . Breast cancer Neg Hx     Social History Social History  Substance Use Topics  . Smoking status: Never Smoker  . Smokeless tobacco: Never Used  . Alcohol use 0.0 oz/week     Comment: 2 beers/month     Allergies   Sulfa antibiotics; Amoxicillin; and Penicillins   Review of Systems Review of Systems  HENT: Positive for dental problem, mouth sores and sore throat.   Respiratory: Negative for shortness of breath.   Cardiovascular: Negative for chest pain.  Gastrointestinal: Negative for abdominal pain.  Neurological: Negative for headaches.  All other systems reviewed and are negative.    Physical Exam Triage Vital Signs ED Triage Vitals  Enc Vitals Group     BP 10/21/16 0853 112/69     Pulse Rate 10/21/16 0853 80     Resp 10/21/16 0853 16     Temp 10/21/16 0853 98.4 F (36.9 C)     Temp Source 10/21/16 0853 Oral     SpO2 10/21/16 0853 98 %     Weight 10/21/16 0850 140 lb (63.5 kg)     Height 10/21/16 0850 5\' 7"  (1.702 m)     Head Circumference --      Peak Flow --      Pain Score 10/21/16 0851 0     Pain Loc --      Pain Edu? --      Excl. in [place]? --     No data found.   Updated Vital Signs BP 112/69 (BP Location: Left Arm)   Pulse 80   Temp 98.4 F (36.9 C) (Oral)   Resp 16   Ht 5\' 7"  (1.702 m)   Wt 140 lb (63.5 kg)   SpO2 98%   BMI 21.93 kg/m   Visual Acuity Right Eye Distance:   Left Eye Distance:   Bilateral Distance:    Right Eye Near:   Left Eye Near:    Bilateral Near:     Physical Exam  Constitutional: She is oriented to person, place, and time. She appears well-developed and  well-nourished.  HENT:  Head: Normocephalic and atraumatic.  Right Ear: Hearing, tympanic membrane, external ear and ear canal normal.  Left Ear: Hearing, tympanic membrane and ear canal normal.  Nose: Nose normal.  Mouth/Throat: Uvula is midline. Oral lesions present. Posterior oropharyngeal erythema present.  Entire mouth was somewhat more hyperemic appears be irritated and inflamed there few small bites but nothing I could definitely say is thrush. The typical cottage cheeselike lesions are not present. She does has adenopathy on the right side but and neck is supple  Eyes: Pupils are equal, round, and reactive to light.  Neck: Normal range of motion. Neck supple.  Pulmonary/Chest: Effort normal.  Musculoskeletal: Normal range of motion.  Neurological: She is alert and oriented to person, place, and time.  Skin: Skin is warm.  Psychiatric: She has a normal mood and affect.  Vitals reviewed.    UC Treatments / Results  Labs (all labs ordered are listed, but only abnormal results are displayed) Labs Reviewed  RAPID STREP SCREEN (NOT AT [facility])  CBG MONITORING, ED    EKG  EKG Interpretation None       Radiology No results found.  Procedures Procedures (including critical care time)  Medications Ordered in UC Medications - No data to display   Initial Impression / Assessment and Plan / UC Course  I have reviewed the triage vital signs and the nursing notes.  Pertinent labs & imaging results that were available  during my care of the patient were reviewed by me and considered in my medical decision making (see chart for details).     By history and presentation this appears to be thrush but we don't have the classic changes in the throat that he would like to see. We do strep test a point of service blood sugar just to make sure that she is not developing diabetes and both of those are normal or negative will proceed with Mycelex troches one tablet 5 times a day for the next 14 days  Final Clinical Impressions(s) / UC Diagnoses   Final diagnoses:  Thrush of mouth and esophagus ([place])    New Prescriptions New Prescriptions   No medications on file   Note: This dictation was prepared with Dragon dictation along with smaller phrase technology. Any transcriptional errors that result from this process are unintentional.  Controlled Substance Prescriptions  Controlled Substance Registry consulted? Not Applicable   Frederich Cha, MD 10/21/16 [phone]

## 2016-10-21 NOTE — ED Triage Notes (Signed)
Per patient c/o had poison ivy x 2 weeks ago. Patient believe  that she has thrush after taking the medication for poison Ivy.

## 2016-10-24 LAB — CULTURE, GROUP A STREP (THRC)

## 2016-11-20 DIAGNOSIS — R0789 Other chest pain: Secondary | ICD-10-CM | POA: Diagnosis not present

## 2016-11-20 DIAGNOSIS — G4733 Obstructive sleep apnea (adult) (pediatric): Secondary | ICD-10-CM | POA: Diagnosis not present

## 2016-11-20 DIAGNOSIS — R002 Palpitations: Secondary | ICD-10-CM | POA: Diagnosis not present

## 2016-11-20 DIAGNOSIS — I493 Ventricular premature depolarization: Secondary | ICD-10-CM | POA: Diagnosis not present

## 2016-12-04 DIAGNOSIS — R0789 Other chest pain: Secondary | ICD-10-CM | POA: Diagnosis not present

## 2016-12-04 DIAGNOSIS — R002 Palpitations: Secondary | ICD-10-CM | POA: Diagnosis not present

## 2016-12-25 ENCOUNTER — Other Ambulatory Visit: Payer: Self-pay | Admitting: Obstetrics and Gynecology

## 2016-12-25 DIAGNOSIS — Z1231 Encounter for screening mammogram for malignant neoplasm of breast: Secondary | ICD-10-CM

## 2017-01-04 ENCOUNTER — Ambulatory Visit
Admission: RE | Admit: 2017-01-04 | Discharge: 2017-01-04 | Disposition: A | Discharge status: Home / Self Care | Payer: 59 | Source: Ambulatory Visit | Attending: Obstetrics and Gynecology | Admitting: Obstetrics and Gynecology

## 2017-01-04 DIAGNOSIS — Z1231 Encounter for screening mammogram for malignant neoplasm of breast: Secondary | ICD-10-CM | POA: Diagnosis not present

## 2017-01-07 DIAGNOSIS — E039 Hypothyroidism, unspecified: Secondary | ICD-10-CM | POA: Diagnosis not present

## 2017-01-07 DIAGNOSIS — F411 Generalized anxiety disorder: Secondary | ICD-10-CM | POA: Diagnosis not present

## 2017-01-07 DIAGNOSIS — M542 Cervicalgia: Secondary | ICD-10-CM | POA: Diagnosis not present

## 2017-01-07 DIAGNOSIS — G4733 Obstructive sleep apnea (adult) (pediatric): Secondary | ICD-10-CM | POA: Diagnosis not present

## 2017-03-14 DIAGNOSIS — D2272 Melanocytic nevi of left lower limb, including hip: Secondary | ICD-10-CM | POA: Diagnosis not present

## 2017-03-14 DIAGNOSIS — D2261 Melanocytic nevi of right upper limb, including shoulder: Secondary | ICD-10-CM | POA: Diagnosis not present

## 2017-03-14 DIAGNOSIS — D225 Melanocytic nevi of trunk: Secondary | ICD-10-CM | POA: Diagnosis not present

## 2017-03-14 DIAGNOSIS — L718 Other rosacea: Secondary | ICD-10-CM | POA: Diagnosis not present

## 2017-03-21 DIAGNOSIS — G4733 Obstructive sleep apnea (adult) (pediatric): Secondary | ICD-10-CM | POA: Diagnosis not present

## 2017-07-24 ENCOUNTER — Encounter: Payer: Self-pay | Admitting: Obstetrics and Gynecology

## 2017-07-24 ENCOUNTER — Ambulatory Visit (INDEPENDENT_AMBULATORY_CARE_PROVIDER_SITE_OTHER): Payer: 59 | Admitting: Obstetrics and Gynecology

## 2017-07-24 VITALS — BP 115/70 | HR 70 | Ht 67.0 in | Wt 156.0 lb

## 2017-07-24 DIAGNOSIS — Z01419 Encounter for gynecological examination (general) (routine) without abnormal findings: Secondary | ICD-10-CM

## 2017-07-24 MED ORDER — ESTRADIOL 0.05 MG/24HR TD PTTW: 1.0000 | MEDICATED_PATCH | TRANSDERMAL | 12 refills | Status: DC

## 2017-07-24 NOTE — Progress Notes (Signed)
Subjective:   Caitlin Washington is a 52 y.o. No obstetric history on file. Caucasian female here for a routine well-woman exam.  No LMP recorded. Patient has had a hysterectomy.    Current complaints: c/o urinary incontinence with exercise. Pt states that she is having to wear double layers of clothing and a pad due to urinary incontinence. Even with this pt is still saturating through clothing and when exercising is saturating through to the bench she is sitting on. c/o hot flashes that are worse at night PCP: Hande       Doesn't desire labs. They are followed by PCP.  Social History: Sexual: heterosexual Marital Status: married Living situation: with spouse Occupation: admission coordinator at [employer] Tobacco/alcohol: no tobacco use Illicit drugs: no history of illicit drug use  The following portions of the patient's history were reviewed and updated as appropriate: allergies, current medications, past family history, past medical history, past social history, past surgical history and problem list.  Past Medical History Past Medical History:  Diagnosis Date  . Anemia   . Anxiety   . Dyspareunia in female   . Hypertension   . Hypothyroidism   . PONV (postoperative nausea and vomiting)    after hysterectomy  . PVC (premature ventricular contraction)    controlled with metoprolol  . Sweating abnormality   . Thyroid disease   . Wears contact lenses     Past Surgical History Past Surgical History:  Procedure Laterality Date  . ABDOMINAL HYSTERECTOMY    . COLONOSCOPY WITH PROPOFOL N/A 06/23/2015   Procedure: COLONOSCOPY WITH PROPOFOL;  Surgeon: Manya Silvas, MD;  Location[facility] ENDOSCOPY;  Service: Endoscopy;  Laterality: N/A;  . FOOT SURGERY Right    buion excision  . HALLUX VALGUS AUSTIN Right 12/14/2015   Procedure: HALLUX VALGUS AUSTIN RIGHT FOOT;  Surgeon: [name], DPM;  Location: [place];  Service: Podiatry;  Laterality: Right;   POPLITEAL  . TONSILLECTOMY    . TUBAL LIGATION      Gynecologic History No obstetric history on file.  No LMP recorded. Patient has had a hysterectomy. Contraception: status post hysterectomy Last Pap: 2017. Results were: normal Last mammogram: 2018. Results were: normal   Obstetric History OB History  No data available    Current Medications Current Outpatient Medications on File Prior to Visit  Medication Sig Dispense Refill  . Calcium Carbonate-Vitamin D 600-400 MG-UNIT tablet Take by mouth. Reported on 04/07/2015    . citalopram (CELEXA) 10 MG tablet TAKE 1 TABLET BY MOUTH ONCE DAILY (Patient taking differently: two tablets daily) 90 tablet 3  . cyanocobalamin (,VITAMIN B-12,) 1000 MCG/ML injection Inject 1 mL (1,000 mcg total) into the muscle once. 1 mL 0  . estradiol (VIVELLE-DOT) 0.0375 MG/24HR PLACE 1 PATCH TRANSDERMAL TWO TIMES A WEEK 24 patch 6  . fluticasone (FLONASE) 50 MCG/ACT nasal spray USE TWO SPRAYS IN EACH NOSTRIL ONCE DAILY 16 g 6  . levothyroxine (SYNTHROID, LEVOTHROID) 88 MCG tablet Take 88 mcg by mouth daily before breakfast.    . progesterone (PROMETRIUM) 100 MG capsule TAKE 1 CAPSULE BY MOUTH NIGHTLY AT BEDTIME 90 capsule 4  . ALPRAZolam (XANAX) 0.25 MG tablet TAKE 1 TABLET BY MOUTH TWICE A DAY AS NEEDED FOR ANXIETY (Patient not taking: Reported on 07/24/2017) 60 tablet 2  . clotrimazole (MYCELEX) 10 MG troche Take 1 tablet (10 mg total) by mouth 5 (five) times daily. Please take for 2 weeks (Patient not taking: Reported on 07/24/2017) 70 tablet  0  . ferrous sulfate 325 (65 FE) MG tablet Take 325 mg by mouth 2 (two) times daily with a meal.    . levothyroxine (SYNTHROID, LEVOTHROID) 88 MCG tablet Take 1.5 tablets (132 mcg total) by mouth daily before breakfast. 30 tablet 6  . vitamin E 400 UNIT capsule Take by mouth. Reported on 04/07/2015     No current facility-administered medications on file prior to visit.     Review of Systems Patient denies any  headaches, blurred vision, shortness of breath, chest pain, abdominal pain, problems with bowel movements, urination, or intercourse. Patient reports that when exercising, coughing, or sneezing that she experiences incontinence of bladder to the point of needing to wear a pad. Pt is anxious about continuing estrogen/progestrone hormone replacement therapy due to the perceived increase risk for cancers Patient reports hot flashes that are worse at night.  Objective:  BP 115/70   Pulse 70   Ht 5\' 7"  (1.702 m)   Wt 156 lb (70.8 kg)   BMI 24.43 kg/m  Physical Exam  General:  Well developed, well nourished, no acute distress. She is alert and oriented x3. Skin:  Warm and dry Neck:  Midline trachea, no thyromegaly or nodules Cardiovascular: Regular rate and rhythm, no murmur heard Lungs:  Effort normal, all lung fields clear to auscultation bilaterally Breasts:  No dominant palpable mass, retraction, or nipple discharge Abdomen:  Soft, non tender, no hepatosplenomegaly or masses Pelvic:  External genitalia is normal in appearance.  The vagina is normal in appearance. The cervix is bulbous, no CMT.  Thin prep pap is not done . Uterus is surgically absent No adnexal masses or tenderness noted. Extremities:  No swelling or varicosities noted Psych:  She has a normal mood and affect  Assessment:   Healthy well-woman exam HRT Stress incontinence Hypothyroidism Anxiety   Plan:  Desires to continue current HRT, but increased vivelle dot to 0.68mcg.  F/U 1 year for annual exam, or sooner if needed Schedule mammogram  Schedule Bone Density Study Referral to Dr Marcelline Mates for pessary fitting  Sanay Belmar Rockney Ghee, CNM

## 2017-08-08 DIAGNOSIS — M542 Cervicalgia: Secondary | ICD-10-CM | POA: Diagnosis not present

## 2017-08-08 DIAGNOSIS — F411 Generalized anxiety disorder: Secondary | ICD-10-CM | POA: Diagnosis not present

## 2017-08-08 DIAGNOSIS — G4733 Obstructive sleep apnea (adult) (pediatric): Secondary | ICD-10-CM | POA: Diagnosis not present

## 2017-08-08 DIAGNOSIS — I493 Ventricular premature depolarization: Secondary | ICD-10-CM | POA: Diagnosis not present

## 2017-08-08 DIAGNOSIS — E039 Hypothyroidism, unspecified: Secondary | ICD-10-CM | POA: Diagnosis not present

## 2017-08-15 DIAGNOSIS — Z0000 Encounter for general adult medical examination without abnormal findings: Secondary | ICD-10-CM | POA: Diagnosis not present

## 2017-08-15 DIAGNOSIS — E039 Hypothyroidism, unspecified: Secondary | ICD-10-CM | POA: Diagnosis not present

## 2017-08-15 DIAGNOSIS — G4733 Obstructive sleep apnea (adult) (pediatric): Secondary | ICD-10-CM | POA: Diagnosis not present

## 2017-08-15 DIAGNOSIS — F411 Generalized anxiety disorder: Secondary | ICD-10-CM | POA: Diagnosis not present

## 2017-08-23 ENCOUNTER — Encounter: Payer: 59 | Admitting: Obstetrics and Gynecology

## 2017-09-09 ENCOUNTER — Other Ambulatory Visit: Payer: Self-pay | Admitting: Obstetrics and Gynecology

## 2017-09-20 ENCOUNTER — Other Ambulatory Visit: Payer: Self-pay | Admitting: Obstetrics and Gynecology

## 2017-10-03 DIAGNOSIS — M7742 Metatarsalgia, left foot: Secondary | ICD-10-CM | POA: Diagnosis not present

## 2017-10-03 DIAGNOSIS — M79671 Pain in right foot: Secondary | ICD-10-CM | POA: Diagnosis not present

## 2017-10-03 DIAGNOSIS — M7741 Metatarsalgia, right foot: Secondary | ICD-10-CM | POA: Diagnosis not present

## 2017-10-03 DIAGNOSIS — M79672 Pain in left foot: Secondary | ICD-10-CM | POA: Diagnosis not present

## 2017-10-03 DIAGNOSIS — D2372 Other benign neoplasm of skin of left lower limb, including hip: Secondary | ICD-10-CM | POA: Diagnosis not present

## 2017-10-10 DIAGNOSIS — H5213 Myopia, bilateral: Secondary | ICD-10-CM | POA: Diagnosis not present

## 2017-10-10 DIAGNOSIS — H43399 Other vitreous opacities, unspecified eye: Secondary | ICD-10-CM | POA: Diagnosis not present

## 2017-10-21 DIAGNOSIS — L718 Other rosacea: Secondary | ICD-10-CM | POA: Diagnosis not present

## 2017-10-21 DIAGNOSIS — L298 Other pruritus: Secondary | ICD-10-CM | POA: Diagnosis not present

## 2017-10-21 DIAGNOSIS — L738 Other specified follicular disorders: Secondary | ICD-10-CM | POA: Diagnosis not present

## 2017-10-21 DIAGNOSIS — R208 Other disturbances of skin sensation: Secondary | ICD-10-CM | POA: Diagnosis not present

## 2017-10-21 DIAGNOSIS — L820 Inflamed seborrheic keratosis: Secondary | ICD-10-CM | POA: Diagnosis not present

## 2017-11-01 ENCOUNTER — Ambulatory Visit: Payer: 59 | Admitting: Obstetrics and Gynecology

## 2017-11-01 ENCOUNTER — Encounter: Payer: Self-pay | Admitting: Obstetrics and Gynecology

## 2017-11-01 VITALS — BP 111/66 | HR 69 | Ht 67.0 in | Wt 157.4 lb

## 2017-11-01 DIAGNOSIS — N644 Mastodynia: Secondary | ICD-10-CM

## 2017-11-01 DIAGNOSIS — N6012 Diffuse cystic mastopathy of left breast: Secondary | ICD-10-CM

## 2017-11-01 DIAGNOSIS — N6011 Diffuse cystic mastopathy of right breast: Secondary | ICD-10-CM

## 2017-11-01 NOTE — Patient Instructions (Signed)
Vit E 800 IU daily for 1-2 weeks and as needed.

## 2017-11-01 NOTE — Progress Notes (Signed)
  Subjective:     Patient ID: Caitlin Washington, female   DOB: 1965-04-26, 52 y.o.   MRN: [id number]  HPI Reports sore spot behind left nipple x 1 week. Both breast feel lumpier in general and tender for the last 2 months. We increased her HRT patch in July and she is experiencing less night sweats and happy with the change.  Review of Systems  All other systems reviewed and are negative.      Objective:   Physical Exam Blood pressure 111/66, pulse 69, height 5\' 7"  (1.702 m), weight 157 lb 6.4 oz (71.4 kg). Breasts: breasts appear normal, no suspicious masses, no skin or nipple changes or axillary nodes, symmetric fibrous changes in both upper outer quadrants.    Assessment:     Fibrocystic breast changes Left breast pain HRT    Plan:     Counseled on normal findings and probable tenderness linked to increasing HRT dose. Doesn't desire stopping it or going down on it. Recommended Vit E 800 IU daily for 1-2 weeks and as needed. She will let me know if breast pain persist.    Ulonda Klosowski,CNM

## 2017-11-12 ENCOUNTER — Ambulatory Visit
Admission: RE | Admit: 2017-11-12 | Discharge: 2017-11-12 | Disposition: A | Discharge status: Home / Self Care | Payer: 59 | Source: Ambulatory Visit | Attending: Obstetrics and Gynecology | Admitting: Obstetrics and Gynecology

## 2017-11-12 DIAGNOSIS — Z01419 Encounter for gynecological examination (general) (routine) without abnormal findings: Secondary | ICD-10-CM | POA: Diagnosis not present

## 2017-11-12 DIAGNOSIS — M85852 Other specified disorders of bone density and structure, left thigh: Secondary | ICD-10-CM | POA: Diagnosis not present

## 2017-11-15 ENCOUNTER — Telehealth: Payer: Self-pay | Admitting: Obstetrics and Gynecology

## 2017-11-15 NOTE — Telephone Encounter (Signed)
pls advise

## 2017-11-15 NOTE — Telephone Encounter (Signed)
The patient LVM at 9:06 this morning asking if her provider could review/release her bone density results from 11/12/17 or if her nurse, Amy, could call @ [phone], her with the results, please advise, thanks.

## 2017-11-19 DIAGNOSIS — M7741 Metatarsalgia, right foot: Secondary | ICD-10-CM | POA: Diagnosis not present

## 2017-11-19 DIAGNOSIS — D2372 Other benign neoplasm of skin of left lower limb, including hip: Secondary | ICD-10-CM | POA: Diagnosis not present

## 2017-11-19 DIAGNOSIS — M2011 Hallux valgus (acquired), right foot: Secondary | ICD-10-CM | POA: Diagnosis not present

## 2017-11-19 DIAGNOSIS — M7742 Metatarsalgia, left foot: Secondary | ICD-10-CM | POA: Diagnosis not present

## 2017-11-19 DIAGNOSIS — M79671 Pain in right foot: Secondary | ICD-10-CM | POA: Diagnosis not present

## 2017-12-27 DIAGNOSIS — M5412 Radiculopathy, cervical region: Secondary | ICD-10-CM | POA: Diagnosis not present

## 2017-12-27 DIAGNOSIS — M25511 Pain in right shoulder: Secondary | ICD-10-CM | POA: Diagnosis not present

## 2018-01-08 DIAGNOSIS — J019 Acute sinusitis, unspecified: Secondary | ICD-10-CM | POA: Diagnosis not present

## 2018-02-06 DIAGNOSIS — Z0000 Encounter for general adult medical examination without abnormal findings: Secondary | ICD-10-CM | POA: Diagnosis not present

## 2018-02-06 DIAGNOSIS — G4733 Obstructive sleep apnea (adult) (pediatric): Secondary | ICD-10-CM | POA: Diagnosis not present

## 2018-02-06 DIAGNOSIS — F411 Generalized anxiety disorder: Secondary | ICD-10-CM | POA: Diagnosis not present

## 2018-02-06 DIAGNOSIS — E039 Hypothyroidism, unspecified: Secondary | ICD-10-CM | POA: Diagnosis not present

## 2018-02-13 DIAGNOSIS — G4733 Obstructive sleep apnea (adult) (pediatric): Secondary | ICD-10-CM | POA: Diagnosis not present

## 2018-02-13 DIAGNOSIS — R002 Palpitations: Secondary | ICD-10-CM | POA: Diagnosis not present

## 2018-02-13 DIAGNOSIS — K219 Gastro-esophageal reflux disease without esophagitis: Secondary | ICD-10-CM | POA: Diagnosis not present

## 2018-02-13 DIAGNOSIS — E039 Hypothyroidism, unspecified: Secondary | ICD-10-CM | POA: Diagnosis not present

## 2018-02-28 DIAGNOSIS — G4733 Obstructive sleep apnea (adult) (pediatric): Secondary | ICD-10-CM | POA: Diagnosis not present

## 2018-03-03 DIAGNOSIS — E538 Deficiency of other specified B group vitamins: Secondary | ICD-10-CM | POA: Diagnosis not present

## 2018-03-12 ENCOUNTER — Ambulatory Visit
Admission: RE | Admit: 2018-03-12 | Discharge: 2018-03-12 | Disposition: A | Discharge status: Home / Self Care | Payer: 59 | Source: Ambulatory Visit | Attending: Obstetrics and Gynecology | Admitting: Obstetrics and Gynecology

## 2018-03-12 ENCOUNTER — Other Ambulatory Visit: Payer: Self-pay | Admitting: Obstetrics and Gynecology

## 2018-03-12 DIAGNOSIS — Z01419 Encounter for gynecological examination (general) (routine) without abnormal findings: Secondary | ICD-10-CM

## 2018-03-12 DIAGNOSIS — Z1231 Encounter for screening mammogram for malignant neoplasm of breast: Secondary | ICD-10-CM | POA: Insufficient documentation

## 2018-03-12 DIAGNOSIS — R921 Mammographic calcification found on diagnostic imaging of breast: Secondary | ICD-10-CM | POA: Insufficient documentation

## 2018-03-12 DIAGNOSIS — R928 Other abnormal and inconclusive findings on diagnostic imaging of breast: Secondary | ICD-10-CM

## 2018-03-17 ENCOUNTER — Encounter: Payer: Self-pay | Admitting: *Deleted

## 2018-03-17 ENCOUNTER — Telehealth: Payer: Self-pay | Admitting: Obstetrics and Gynecology

## 2018-03-17 NOTE — Telephone Encounter (Signed)
The patient called and stated that she had a mammogram last week and something was seen on her right breast. The patient has to go in tomorrow for another mammogram. The patient logged into MyChart to review her first mammogram but was unable to, The patient is requesting that Melody review it and return her call. Please advise.

## 2018-03-17 NOTE — Telephone Encounter (Signed)
Sent mychart message

## 2018-03-18 ENCOUNTER — Ambulatory Visit
Admission: RE | Admit: 2018-03-18 | Discharge: 2018-03-18 | Disposition: A | Discharge status: Home / Self Care | Payer: 59 | Source: Ambulatory Visit | Attending: Obstetrics and Gynecology | Admitting: Obstetrics and Gynecology

## 2018-03-18 DIAGNOSIS — R921 Mammographic calcification found on diagnostic imaging of breast: Secondary | ICD-10-CM

## 2018-03-18 DIAGNOSIS — R928 Other abnormal and inconclusive findings on diagnostic imaging of breast: Secondary | ICD-10-CM

## 2018-05-20 MED FILL — CITALOPRAM HBR 20 MG TABLET: 20 | 30 days supply | Qty: 30 | Fill #0 | Status: TO

## 2018-05-21 MED FILL — DOTTI 0.05 MG/24HR PTTW: 0.05 | 28 days supply | Qty: 8 | Fill #0

## 2018-05-21 MED FILL — PROGESTERONE 100 MG CAPSULE: 100 | 90 days supply | Qty: 90 | Fill #0

## 2018-05-21 MED FILL — FLUTICASONE PROP 50 MCG SPR: 50 | 30 days supply | Qty: 16 | Fill #0

## 2018-05-30 DIAGNOSIS — G4733 Obstructive sleep apnea (adult) (pediatric): Secondary | ICD-10-CM | POA: Diagnosis not present

## 2018-06-10 DIAGNOSIS — G4733 Obstructive sleep apnea (adult) (pediatric): Secondary | ICD-10-CM | POA: Diagnosis not present

## 2018-06-10 DIAGNOSIS — I998 Other disorder of circulatory system: Secondary | ICD-10-CM | POA: Diagnosis not present

## 2018-06-10 DIAGNOSIS — I493 Ventricular premature depolarization: Secondary | ICD-10-CM | POA: Diagnosis not present

## 2018-06-10 DIAGNOSIS — R002 Palpitations: Secondary | ICD-10-CM | POA: Diagnosis not present

## 2018-06-12 MED FILL — DOTTI 0.05 MG/24HR PTTW: 0.05 | 28 days supply | Qty: 8 | Fill #1

## 2018-06-24 DIAGNOSIS — I493 Ventricular premature depolarization: Secondary | ICD-10-CM | POA: Diagnosis not present

## 2018-06-24 DIAGNOSIS — I998 Other disorder of circulatory system: Secondary | ICD-10-CM | POA: Diagnosis not present

## 2018-07-08 DIAGNOSIS — E039 Hypothyroidism, unspecified: Secondary | ICD-10-CM | POA: Diagnosis not present

## 2018-07-08 DIAGNOSIS — R070 Pain in throat: Secondary | ICD-10-CM | POA: Diagnosis not present

## 2018-07-08 DIAGNOSIS — F411 Generalized anxiety disorder: Secondary | ICD-10-CM | POA: Diagnosis not present

## 2018-07-08 DIAGNOSIS — E538 Deficiency of other specified B group vitamins: Secondary | ICD-10-CM | POA: Diagnosis not present

## 2018-07-08 DIAGNOSIS — R5383 Other fatigue: Secondary | ICD-10-CM | POA: Diagnosis not present

## 2018-07-08 DIAGNOSIS — G4733 Obstructive sleep apnea (adult) (pediatric): Secondary | ICD-10-CM | POA: Diagnosis not present

## 2018-07-09 DIAGNOSIS — T1511XA Foreign body in conjunctival sac, right eye, initial encounter: Secondary | ICD-10-CM | POA: Diagnosis not present

## 2018-07-10 MED FILL — DOTTI 0.05 MG/24HR PTTW: 0.05 | 28 days supply | Qty: 8 | Fill #2

## 2018-07-29 ENCOUNTER — Encounter: Payer: 59 | Admitting: Obstetrics and Gynecology

## 2018-08-01 ENCOUNTER — Ambulatory Visit (INDEPENDENT_AMBULATORY_CARE_PROVIDER_SITE_OTHER): Payer: 59 | Admitting: Obstetrics and Gynecology

## 2018-08-01 ENCOUNTER — Other Ambulatory Visit: Payer: Self-pay

## 2018-08-01 ENCOUNTER — Encounter: Payer: Self-pay | Admitting: Obstetrics and Gynecology

## 2018-08-01 VITALS — BP 110/64 | HR 75 | Ht 67.0 in | Wt 164.0 lb

## 2018-08-01 DIAGNOSIS — Z01419 Encounter for gynecological examination (general) (routine) without abnormal findings: Secondary | ICD-10-CM

## 2018-08-01 MED ORDER — ESTRADIOL 0.05 MG/24HR TD PTTW: 1.0000 | MEDICATED_PATCH | TRANSDERMAL | 12 refills | Status: DC

## 2018-08-01 MED ORDER — PROGESTERONE MICRONIZED 100 MG PO CAPS: ORAL_CAPSULE | ORAL | 4 refills | Status: DC

## 2018-08-01 NOTE — Progress Notes (Signed)
Subjective:   Caitlin Washington is a 53 y.o. G32P2012 Caucasian female here for a routine well-woman exam.  No LMP recorded. Patient has had a hysterectomy.    Current complaints: weight gain- hasn't been working out since pandemic. PCP: Hande       doesn't desire labs  Social History: Sexual: heterosexual Marital Status: married Living situation: with spouse Occupation: [employer] Tobacco/alcohol: no tobacco use Illicit drugs: no history of illicit drug use  The following portions of the patient's history were reviewed and updated as appropriate: allergies, current medications, past family history, past medical history, past social history, past surgical history and problem list.  Past Medical History Past Medical History:  Diagnosis Date  . Anemia   . Anxiety   . Dyspareunia in female   . Hypertension   . Hypothyroidism   . PONV (postoperative nausea and vomiting)    after hysterectomy  . PVC (premature ventricular contraction)    controlled with metoprolol  . Sweating abnormality   . Thyroid disease   . Wears contact lenses     Past Surgical History Past Surgical History:  Procedure Laterality Date  . ABDOMINAL HYSTERECTOMY    . COLONOSCOPY WITH PROPOFOL N/A 06/23/2015   Procedure: COLONOSCOPY WITH PROPOFOL;  Surgeon: Manya Silvas, MD;  Location: [facility] ENDOSCOPY;  Service: Endoscopy;  Laterality: N/A;  . FOOT SURGERY Right    buion excision  . HALLUX VALGUS AUSTIN Right 12/14/2015   Procedure: HALLUX VALGUS AUSTIN RIGHT FOOT;  Surgeon: [name], DPM;  Location: [place];  Service: Podiatry;  Laterality: Right;  POPLITEAL  . TONSILLECTOMY    . TUBAL LIGATION      Gynecologic History [id number]  No LMP recorded. Patient has had a hysterectomy. Contraception: status post hysterectomy Last Pap: 2017. Results were: normal Last mammogram: 02/2018. Results were: normal   Obstetric History OB History  Gravida Para Term Preterm AB Living  3  2 2   1 2   SAB TAB Ectopic Multiple Live Births  1       2    # Outcome Date GA Lbr Len/2nd Weight Sex Delivery Anes PTL Lv  3 Term 2005   6 lb 2.1 oz (2.781 kg) M Vag-Spont   LIV  2 Term 2001   5 lb 2.1 oz (2.327 kg) M Vag-Spont   LIV  1 SAB 2000            Current Medications Current Outpatient Medications on File Prior to Visit  Medication Sig Dispense Refill  . ALPRAZolam (XANAX) 0.25 MG tablet TAKE 1 TABLET BY MOUTH TWICE A DAY AS NEEDED FOR ANXIETY 60 tablet 2  . citalopram (CELEXA) 10 MG tablet TAKE 1 TABLET BY MOUTH ONCE DAILY (Patient taking differently: two tablets daily) 90 tablet 3  . cyanocobalamin (,VITAMIN B-12,) 1000 MCG/ML injection Inject 1 mL (1,000 mcg total) into the muscle once. 1 mL 0  . estradiol (VIVELLE-DOT) 0.05 MG/24HR patch Place 1 patch (0.05 mg total) onto the skin 2 (two) times a week. 8 patch 12  . ferrous sulfate 325 (65 FE) MG tablet Take 325 mg by mouth 2 (two) times daily with a meal.    . fluticasone (FLONASE) 50 MCG/ACT nasal spray USE TWO SPRAYS IN EACH NOSTRIL ONCE DAILY 16 g 6  . levothyroxine (SYNTHROID, LEVOTHROID) 88 MCG tablet Take 88 mcg by mouth daily before breakfast.    . pantoprazole (PROTONIX) 20 MG tablet Take 20 mg by mouth daily.    Marland Kitchen  progesterone (PROMETRIUM) 100 MG capsule TAKE 1 CAPSULE BY MOUTH NIGHTLY AT BEDTIME 90 capsule 4   No current facility-administered medications on file prior to visit.     Review of Systems Patient denies any headaches, blurred vision, shortness of breath, chest pain, abdominal pain, problems with bowel movements, urination, or intercourse.  Objective:  BP 110/64   Pulse 75   Ht 5\' 7"  (1.702 m)   Wt 164 lb (74.4 kg)   BMI 25.69 kg/m  Physical Exam  General:  Well developed, well nourished, no acute distress. She is alert and oriented x3. Skin:  Warm and dry Neck:  Midline trachea, no thyromegaly or nodules Cardiovascular: Regular rate and rhythm, no murmur heard Lungs:  Effort normal, all  lung fields clear to auscultation bilaterally Breasts:  No dominant palpable mass, retraction, or nipple discharge Abdomen:  Soft, non tender, no hepatosplenomegaly or masses Pelvic:  External genitalia is normal in appearance.  The vagina is normal in appearance.  Thin prep pap is not done. Uterus is surgically absent  No adnexal masses or tenderness noted. Extremities:  No swelling or varicosities noted Psych:  She has a normal mood and affect  Assessment:   Healthy well-woman exam  Plan:  meds refilled F/U 1 year for AE, or sooner if needed Mammogram UTD Colonoscopy UTD  Melody Rockney Ghee, CNM

## 2018-08-22 DIAGNOSIS — K219 Gastro-esophageal reflux disease without esophagitis: Secondary | ICD-10-CM | POA: Diagnosis not present

## 2018-08-22 DIAGNOSIS — R002 Palpitations: Secondary | ICD-10-CM | POA: Diagnosis not present

## 2018-08-22 DIAGNOSIS — E039 Hypothyroidism, unspecified: Secondary | ICD-10-CM | POA: Diagnosis not present

## 2018-08-22 DIAGNOSIS — G4733 Obstructive sleep apnea (adult) (pediatric): Secondary | ICD-10-CM | POA: Diagnosis not present

## 2018-08-28 DIAGNOSIS — G4733 Obstructive sleep apnea (adult) (pediatric): Secondary | ICD-10-CM | POA: Diagnosis not present

## 2018-09-05 DIAGNOSIS — G4733 Obstructive sleep apnea (adult) (pediatric): Secondary | ICD-10-CM | POA: Diagnosis not present

## 2018-09-05 DIAGNOSIS — Z0000 Encounter for general adult medical examination without abnormal findings: Secondary | ICD-10-CM | POA: Diagnosis not present

## 2018-09-05 DIAGNOSIS — E039 Hypothyroidism, unspecified: Secondary | ICD-10-CM | POA: Diagnosis not present

## 2018-09-05 DIAGNOSIS — K219 Gastro-esophageal reflux disease without esophagitis: Secondary | ICD-10-CM | POA: Diagnosis not present

## 2018-10-10 DIAGNOSIS — M79671 Pain in right foot: Secondary | ICD-10-CM | POA: Diagnosis not present

## 2018-10-10 DIAGNOSIS — D2372 Other benign neoplasm of skin of left lower limb, including hip: Secondary | ICD-10-CM | POA: Diagnosis not present

## 2018-10-10 DIAGNOSIS — M79672 Pain in left foot: Secondary | ICD-10-CM | POA: Diagnosis not present

## 2018-10-10 DIAGNOSIS — T85848A Pain due to other internal prosthetic devices, implants and grafts, initial encounter: Secondary | ICD-10-CM | POA: Diagnosis not present

## 2018-10-17 ENCOUNTER — Other Ambulatory Visit: Payer: Self-pay | Admitting: Obstetrics and Gynecology

## 2018-10-30 DIAGNOSIS — E538 Deficiency of other specified B group vitamins: Secondary | ICD-10-CM | POA: Diagnosis not present

## 2018-10-30 DIAGNOSIS — T85848A Pain due to other internal prosthetic devices, implants and grafts, initial encounter: Secondary | ICD-10-CM | POA: Diagnosis not present

## 2018-11-26 DIAGNOSIS — G4733 Obstructive sleep apnea (adult) (pediatric): Secondary | ICD-10-CM | POA: Diagnosis not present

## 2018-12-03 DIAGNOSIS — H5213 Myopia, bilateral: Secondary | ICD-10-CM | POA: Diagnosis not present

## 2018-12-30 DIAGNOSIS — M79671 Pain in right foot: Secondary | ICD-10-CM | POA: Diagnosis not present

## 2018-12-30 DIAGNOSIS — M7751 Other enthesopathy of right foot: Secondary | ICD-10-CM | POA: Diagnosis not present

## 2019-01-05 ENCOUNTER — Telehealth: Payer: Self-pay | Admitting: Obstetrics and Gynecology

## 2019-01-05 NOTE — Telephone Encounter (Signed)
Pt called in pt needs a order for a mammogram sent over to mammogram to [place]. Please advise

## 2019-01-07 NOTE — Telephone Encounter (Signed)
Pt information that she has appointment with [facility] on Friday and it would be best to have a visit with [facility] before placing an order.

## 2019-01-09 ENCOUNTER — Encounter: Payer: Self-pay | Admitting: Obstetrics and Gynecology

## 2019-01-09 ENCOUNTER — Other Ambulatory Visit: Payer: Self-pay

## 2019-01-09 ENCOUNTER — Ambulatory Visit (INDEPENDENT_AMBULATORY_CARE_PROVIDER_SITE_OTHER): Payer: 59 | Admitting: Obstetrics and Gynecology

## 2019-01-09 VITALS — BP 108/66 | HR 68 | Ht 67.0 in | Wt 161.5 lb

## 2019-01-09 DIAGNOSIS — R6882 Decreased libido: Secondary | ICD-10-CM | POA: Diagnosis not present

## 2019-01-09 DIAGNOSIS — N6011 Diffuse cystic mastopathy of right breast: Secondary | ICD-10-CM

## 2019-01-09 DIAGNOSIS — Z79890 Hormone replacement therapy (postmenopausal): Secondary | ICD-10-CM

## 2019-01-09 DIAGNOSIS — N644 Mastodynia: Secondary | ICD-10-CM | POA: Diagnosis not present

## 2019-01-09 DIAGNOSIS — N6012 Diffuse cystic mastopathy of left breast: Secondary | ICD-10-CM | POA: Diagnosis not present

## 2019-01-09 NOTE — Progress Notes (Signed)
GYNECOLOGY PROGRESS NOTE  Subjective:    Patient ID: Caitlin Washington, female    DOB: 1965/07/08, 53 y.o.   MRN: [id number]  HPI  Patient is a 53 y.o. G62P2012 female who presents for complaints of left breast pain x 3 weeks.  Tyronda is previously a patient of Melody [place], CNM. She reports that there is a thickened area of this same breast.  Also notes a lymph node under the right axilla that appeared last week.  She does have a history of fibrocystic breasts bilaterally.  Her last mammogram was in February 2020 and was normal.  She does not have a personal or family history of breast cancer.  She is currently taking combined HRT.  She has a prior history of hysterectomy. She wonders if she should discontinue her HRT as she has been on the medications for ~ 5 years.   Of note, she also complains of decreased libido, despite being on HRT.  In addition, patient reports that her husband is having some issues with ED.    The following portions of the patient's history were reviewed and updated as appropriate:   She  has a past medical history of Anemia, Anxiety, Dyspareunia in female, Hypertension, Hypothyroidism, PONV (postoperative nausea and vomiting), PVC (premature ventricular contraction), Sweating abnormality, Thyroid disease, and Wears contact lenses.   She  has a past surgical history that includes Abdominal hysterectomy; Tonsillectomy; Tubal ligation; Colonoscopy with propofol (N/A, 06/23/2015); Hallux valgus austin (Right, 12/14/2015); and Foot surgery (Right).   Her family history includes Heart disease in her maternal grandmother and paternal grandfather; Hypertension in her father.   She  reports that she has never smoked. She has never used smokeless tobacco. She reports current alcohol use. She reports that she does not use drugs.    Current Outpatient Medications on File Prior to Visit  Medication Sig Dispense Refill  . ALPRAZolam (XANAX) 0.25 MG tablet TAKE 1 TABLET  BY MOUTH TWICE A DAY AS NEEDED FOR ANXIETY 60 tablet 2  . citalopram (CELEXA) 10 MG tablet TAKE 1 TABLET BY MOUTH ONCE DAILY (Patient taking differently: two tablets daily) 90 tablet 3  . cyanocobalamin (,VITAMIN B-12,) 1000 MCG/ML injection Inject 1 mL (1,000 mcg total) into the muscle once. 1 mL 0  . estradiol (VIVELLE-DOT) 0.05 MG/24HR patch Place 1 patch (0.05 mg total) onto the skin 2 (two) times a week. 8 patch 12  . fluticasone (FLONASE) 50 MCG/ACT nasal spray USE TWO SPRAYS IN EACH NOSTRIL ONCE DAILY 16 g 2  . levothyroxine (SYNTHROID, LEVOTHROID) 88 MCG tablet Take 88 mcg by mouth daily before breakfast.    . pantoprazole (PROTONIX) 20 MG tablet Take 20 mg by mouth daily.    . progesterone (PROMETRIUM) 100 MG capsule 1 by mouth nightly for three weeks every month 90 capsule 4  . ferrous sulfate 325 (65 FE) MG tablet Take 325 mg by mouth 2 (two) times daily with a meal.     No current facility-administered medications on file prior to visit.   She is allergic to sulfa antibiotics; amoxicillin; and penicillins..  Review of Systems Pertinent items noted in HPI and remainder of comprehensive ROS otherwise negative.   Objective:   Blood pressure 108/66, pulse 68, height 5\' 7"  (1.702 m), weight 161 lb 8 oz (73.3 kg). General appearance: alert and no distress Breasts: negative findings: both breasts normal in size and symmetry, normal contour with no evidence of flattening or dimpling, skin normal, nipples everted without rashes  or discharge, positive findings: fibrocystic changes bilaterally, tenderness of left breast, solitary right axillary lymph node.  Skin: Skin color, texture, turgor normal. No rashes or lesions Neurologic: Grossly normal   Assessment:   Fibrocystic breasts Menopausal on HRT Breast tenderness Decreased libido  Plan:   1. Discussion had with patient that her breast pain could be due to several different factors, including her use of combined HRT and her  history of fibrocystic breast disease. As patient questioning if she should discontinue her hormones, recommend weaning from progesterone first to see if her symptoms improve.  Also, encouraged to decrease intake of caffeine (as she notes she may be drinking an extra cup of coffee a day due to the cold weather), ice packs, and NSAIDs.  Reassured of negative findings especially with history of normal mammogram.  Also noted that enlarged lymph node on contralateral side may not be related, and may be more related to her overall stress levels and health. Patient does report being a little more stressed lately at home due to one of her children. Discussed methods of stress relief, including yoga or other exercising, healthy diet, and she also has a prescription for Xanax if stress gets intense (however notes she tries to avoid use of this medication as much as possible as she does not like taking a lot of medications).  Current symptoms will likely resolve with time.  2. HRT use, discussed that if patient desires to come off of meds, should start with Prometrium.  Once weaned off, can then begin to wean from estradiol patch.  If she notices that significant symptoms are still present, can remain on the patch and re-evaluate symptoms again next year.  3. Decreased libido, discussed options for management, including natural herbal remedies (given handout today).  Can also consider use of other prescriptions such as Scream Cream, testosterone injections, or if she continues to require the use of her estradiol, can switch to [employer]. Will f/u at next visit.   RTC in 1 month to re-evaluate symptoms.    A total of 15 minutes were spent face-to-face with the patient during this encounter and over half of that time dealt with counseling and coordination of care.  Rubie Maid, MD [facility]

## 2019-01-09 NOTE — Progress Notes (Signed)
Pt is present due to having breast pain. Pt stated having left breast pain/sore x 3 weeks and right under area lump noticed x 1 week. Pt's last mammogram was 03/12/18 and had to go back to redo the right breast on 03/18/18.

## 2019-01-09 NOTE — Patient Instructions (Signed)

## 2019-01-30 DIAGNOSIS — Z23 Encounter for immunization: Secondary | ICD-10-CM | POA: Diagnosis not present

## 2019-02-19 DIAGNOSIS — H60332 Swimmer's ear, left ear: Secondary | ICD-10-CM | POA: Diagnosis not present

## 2019-02-24 DIAGNOSIS — G4733 Obstructive sleep apnea (adult) (pediatric): Secondary | ICD-10-CM | POA: Diagnosis not present

## 2019-03-06 DIAGNOSIS — E039 Hypothyroidism, unspecified: Secondary | ICD-10-CM | POA: Diagnosis not present

## 2019-03-06 DIAGNOSIS — G4733 Obstructive sleep apnea (adult) (pediatric): Secondary | ICD-10-CM | POA: Diagnosis not present

## 2019-03-06 DIAGNOSIS — Z0000 Encounter for general adult medical examination without abnormal findings: Secondary | ICD-10-CM | POA: Diagnosis not present

## 2019-03-06 DIAGNOSIS — K219 Gastro-esophageal reflux disease without esophagitis: Secondary | ICD-10-CM | POA: Diagnosis not present

## 2019-03-09 DIAGNOSIS — E039 Hypothyroidism, unspecified: Secondary | ICD-10-CM | POA: Diagnosis not present

## 2019-03-09 DIAGNOSIS — G4733 Obstructive sleep apnea (adult) (pediatric): Secondary | ICD-10-CM | POA: Diagnosis not present

## 2019-03-09 DIAGNOSIS — E538 Deficiency of other specified B group vitamins: Secondary | ICD-10-CM | POA: Diagnosis not present

## 2019-03-09 DIAGNOSIS — K219 Gastro-esophageal reflux disease without esophagitis: Secondary | ICD-10-CM | POA: Diagnosis not present

## 2019-03-09 DIAGNOSIS — F411 Generalized anxiety disorder: Secondary | ICD-10-CM | POA: Diagnosis not present

## 2019-04-07 DIAGNOSIS — M7542 Impingement syndrome of left shoulder: Secondary | ICD-10-CM | POA: Diagnosis not present

## 2019-04-23 DIAGNOSIS — J019 Acute sinusitis, unspecified: Secondary | ICD-10-CM | POA: Diagnosis not present

## 2019-04-27 ENCOUNTER — Other Ambulatory Visit: Payer: Self-pay | Admitting: Internal Medicine

## 2019-05-07 ENCOUNTER — Other Ambulatory Visit: Payer: Self-pay | Admitting: Unknown Physician Specialty

## 2019-05-19 DIAGNOSIS — M7542 Impingement syndrome of left shoulder: Secondary | ICD-10-CM | POA: Diagnosis not present

## 2019-05-26 DIAGNOSIS — G4733 Obstructive sleep apnea (adult) (pediatric): Secondary | ICD-10-CM | POA: Diagnosis not present

## 2019-05-26 DIAGNOSIS — M7542 Impingement syndrome of left shoulder: Secondary | ICD-10-CM | POA: Diagnosis not present

## 2019-05-26 DIAGNOSIS — M25512 Pain in left shoulder: Secondary | ICD-10-CM | POA: Diagnosis not present

## 2019-05-28 DIAGNOSIS — M25512 Pain in left shoulder: Secondary | ICD-10-CM | POA: Diagnosis not present

## 2019-05-28 DIAGNOSIS — M7542 Impingement syndrome of left shoulder: Secondary | ICD-10-CM | POA: Diagnosis not present

## 2019-06-02 DIAGNOSIS — M25512 Pain in left shoulder: Secondary | ICD-10-CM | POA: Diagnosis not present

## 2019-06-02 DIAGNOSIS — M7542 Impingement syndrome of left shoulder: Secondary | ICD-10-CM | POA: Diagnosis not present

## 2019-06-02 DIAGNOSIS — R600 Localized edema: Secondary | ICD-10-CM | POA: Diagnosis not present

## 2019-06-05 DIAGNOSIS — M7542 Impingement syndrome of left shoulder: Secondary | ICD-10-CM | POA: Diagnosis not present

## 2019-06-05 DIAGNOSIS — M25512 Pain in left shoulder: Secondary | ICD-10-CM | POA: Diagnosis not present

## 2019-06-09 DIAGNOSIS — M7542 Impingement syndrome of left shoulder: Secondary | ICD-10-CM | POA: Diagnosis not present

## 2019-06-09 DIAGNOSIS — M25512 Pain in left shoulder: Secondary | ICD-10-CM | POA: Diagnosis not present

## 2019-06-11 DIAGNOSIS — R400 Somnolence: Secondary | ICD-10-CM | POA: Diagnosis not present

## 2019-06-11 DIAGNOSIS — E538 Deficiency of other specified B group vitamins: Secondary | ICD-10-CM | POA: Diagnosis not present

## 2019-06-12 DIAGNOSIS — M25512 Pain in left shoulder: Secondary | ICD-10-CM | POA: Diagnosis not present

## 2019-06-12 DIAGNOSIS — M7542 Impingement syndrome of left shoulder: Secondary | ICD-10-CM | POA: Diagnosis not present

## 2019-06-15 DIAGNOSIS — M7542 Impingement syndrome of left shoulder: Secondary | ICD-10-CM | POA: Diagnosis not present

## 2019-06-15 DIAGNOSIS — M25512 Pain in left shoulder: Secondary | ICD-10-CM | POA: Diagnosis not present

## 2019-06-26 DIAGNOSIS — L239 Allergic contact dermatitis, unspecified cause: Secondary | ICD-10-CM | POA: Diagnosis not present

## 2019-07-16 DIAGNOSIS — E538 Deficiency of other specified B group vitamins: Secondary | ICD-10-CM | POA: Diagnosis not present

## 2019-08-03 DIAGNOSIS — M542 Cervicalgia: Secondary | ICD-10-CM | POA: Diagnosis not present

## 2019-08-04 ENCOUNTER — Encounter: Payer: 59 | Admitting: Obstetrics and Gynecology

## 2019-08-05 DIAGNOSIS — D2262 Melanocytic nevi of left upper limb, including shoulder: Secondary | ICD-10-CM | POA: Diagnosis not present

## 2019-08-05 DIAGNOSIS — L821 Other seborrheic keratosis: Secondary | ICD-10-CM | POA: Diagnosis not present

## 2019-08-05 DIAGNOSIS — D485 Neoplasm of uncertain behavior of skin: Secondary | ICD-10-CM | POA: Diagnosis not present

## 2019-08-05 DIAGNOSIS — D225 Melanocytic nevi of trunk: Secondary | ICD-10-CM | POA: Diagnosis not present

## 2019-08-05 DIAGNOSIS — R58 Hemorrhage, not elsewhere classified: Secondary | ICD-10-CM | POA: Diagnosis not present

## 2019-08-05 DIAGNOSIS — D2261 Melanocytic nevi of right upper limb, including shoulder: Secondary | ICD-10-CM | POA: Diagnosis not present

## 2019-08-05 DIAGNOSIS — L565 Disseminated superficial actinic porokeratosis (DSAP): Secondary | ICD-10-CM | POA: Diagnosis not present

## 2019-08-06 DIAGNOSIS — M62838 Other muscle spasm: Secondary | ICD-10-CM | POA: Diagnosis not present

## 2019-08-06 DIAGNOSIS — M5413 Radiculopathy, cervicothoracic region: Secondary | ICD-10-CM | POA: Diagnosis not present

## 2019-08-06 DIAGNOSIS — M9901 Segmental and somatic dysfunction of cervical region: Secondary | ICD-10-CM | POA: Diagnosis not present

## 2019-08-10 DIAGNOSIS — M9901 Segmental and somatic dysfunction of cervical region: Secondary | ICD-10-CM | POA: Diagnosis not present

## 2019-08-10 DIAGNOSIS — M62838 Other muscle spasm: Secondary | ICD-10-CM | POA: Diagnosis not present

## 2019-08-10 DIAGNOSIS — M5413 Radiculopathy, cervicothoracic region: Secondary | ICD-10-CM | POA: Diagnosis not present

## 2019-08-12 ENCOUNTER — Encounter: Payer: Self-pay | Admitting: Obstetrics and Gynecology

## 2019-08-12 ENCOUNTER — Other Ambulatory Visit: Payer: Self-pay

## 2019-08-12 ENCOUNTER — Ambulatory Visit (INDEPENDENT_AMBULATORY_CARE_PROVIDER_SITE_OTHER): Payer: 59 | Admitting: Obstetrics and Gynecology

## 2019-08-12 ENCOUNTER — Other Ambulatory Visit: Payer: Self-pay | Admitting: Obstetrics and Gynecology

## 2019-08-12 VITALS — BP 103/69 | HR 71 | Ht 67.0 in | Wt 163.9 lb

## 2019-08-12 DIAGNOSIS — E039 Hypothyroidism, unspecified: Secondary | ICD-10-CM

## 2019-08-12 DIAGNOSIS — Z1231 Encounter for screening mammogram for malignant neoplasm of breast: Secondary | ICD-10-CM | POA: Diagnosis not present

## 2019-08-12 DIAGNOSIS — F411 Generalized anxiety disorder: Secondary | ICD-10-CM

## 2019-08-12 DIAGNOSIS — Z01419 Encounter for gynecological examination (general) (routine) without abnormal findings: Secondary | ICD-10-CM

## 2019-08-12 DIAGNOSIS — Z79890 Hormone replacement therapy (postmenopausal): Secondary | ICD-10-CM | POA: Diagnosis not present

## 2019-08-12 MED ORDER — ESTRADIOL 0.05 MG/24HR TD PTTW: 1.0000 | MEDICATED_PATCH | TRANSDERMAL | 12 refills | Status: DC

## 2019-08-12 NOTE — Progress Notes (Signed)
Pt present for annual exam. Pt stated that she was doing well no problems. Pt had covid vaccine 01/30/2019 and 03/04/2019.

## 2019-08-12 NOTE — Patient Instructions (Signed)
Health Maintenance for Postmenopausal Women Menopause is a normal process in which your ability to get pregnant comes to an end. This process happens slowly over many months or years, usually between the ages of 48 and 55. Menopause is complete when you have missed your menstrual periods for 12 months. It is important to talk with your health care provider about some of the most common conditions that affect women after menopause (postmenopausal women). These include heart disease, cancer, and bone loss (osteoporosis). Adopting a healthy lifestyle and getting preventive care can help to promote your health and wellness. The actions you take can also lower your chances of developing some of these common conditions. What should I know about menopause? During menopause, you may get a number of symptoms, such as:  Hot flashes. These can be moderate or severe.  Night sweats.  Decrease in sex drive.  Mood swings.  Headaches.  Tiredness.  Irritability.  Memory problems.  Insomnia. Choosing to treat or not to treat these symptoms is a decision that you make with your health care provider. Do I need hormone replacement therapy?  Hormone replacement therapy is effective in treating symptoms that are caused by menopause, such as hot flashes and night sweats.  Hormone replacement carries certain risks, especially as you become older. If you are thinking about using estrogen or estrogen with progestin, discuss the benefits and risks with your health care provider. What is my risk for heart disease and stroke? The risk of heart disease, heart attack, and stroke increases as you age. One of the causes may be a change in the body's hormones during menopause. This can affect how your body uses dietary fats, triglycerides, and cholesterol. Heart attack and stroke are medical emergencies. There are many things that you can do to help prevent heart disease and stroke. Watch your blood pressure  High  blood pressure causes heart disease and increases the risk of stroke. This is more likely to develop in people who have high blood pressure readings, are of African descent, or are overweight.  Have your blood pressure checked: ? Every 3-5 years if you are 18-39 years of age. ? Every year if you are 40 years old or older. Eat a healthy diet   Eat a diet that includes plenty of vegetables, fruits, low-fat dairy products, and lean protein.  Do not eat a lot of foods that are high in solid fats, added sugars, or sodium. Get regular exercise Get regular exercise. This is one of the most important things you can do for your health. Most adults should:  Try to exercise for at least 150 minutes each week. The exercise should increase your heart rate and make you sweat (moderate-intensity exercise).  Try to do strengthening exercises at least twice each week. Do these in addition to the moderate-intensity exercise.  Spend less time sitting. Even light physical activity can be beneficial. Other tips  Work with your health care provider to achieve or maintain a healthy weight.  Do not use any products that contain nicotine or tobacco, such as cigarettes, e-cigarettes, and chewing tobacco. If you need help quitting, ask your health care provider.  Know your numbers. Ask your health care provider to check your cholesterol and your blood sugar (glucose). Continue to have your blood tested as directed by your health care provider. Do I need screening for cancer? Depending on your health history and family history, you may need to have cancer screening at different stages of your life. This   may include screening for:  Breast cancer.  Cervical cancer.  Lung cancer.  Colorectal cancer. What is my risk for osteoporosis? After menopause, you may be at increased risk for osteoporosis. Osteoporosis is a condition in which bone destruction happens more quickly than new bone creation. To help prevent  osteoporosis or the bone fractures that can happen because of osteoporosis, you may take the following actions:  If you are 19-50 years old, get at least 1,000 mg of calcium and at least 600 mg of vitamin D per day.  If you are older than age 50 but younger than age 70, get at least 1,200 mg of calcium and at least 600 mg of vitamin D per day.  If you are older than age 70, get at least 1,200 mg of calcium and at least 800 mg of vitamin D per day. Smoking and drinking excessive alcohol increase the risk of osteoporosis. Eat foods that are rich in calcium and vitamin D, and do weight-bearing exercises several times each week as directed by your health care provider. How does menopause affect my mental health? Depression may occur at any age, but it is more common as you become older. Common symptoms of depression include:  Low or sad mood.  Changes in sleep patterns.  Changes in appetite or eating patterns.  Feeling an overall lack of motivation or enjoyment of activities that you previously enjoyed.  Frequent crying spells. Talk with your health care provider if you think that you are experiencing depression. General instructions See your health care provider for regular wellness exams and vaccines. This may include:  Scheduling regular health, dental, and eye exams.  Getting and maintaining your vaccines. These include: ? Influenza vaccine. Get this vaccine each year before the flu season begins. ? Pneumonia vaccine. ? Shingles vaccine. ? Tetanus, diphtheria, and pertussis (Tdap) booster vaccine. Your health care provider may also recommend other immunizations. Tell your health care provider if you have ever been abused or do not feel safe at home. Summary  Menopause is a normal process in which your ability to get pregnant comes to an end.  This condition causes hot flashes, night sweats, decreased interest in sex, mood swings, headaches, or lack of sleep.  Treatment for this  condition may include hormone replacement therapy.  Take actions to keep yourself healthy, including exercising regularly, eating a healthy diet, watching your weight, and checking your blood pressure and blood sugar levels.  Get screened for cancer and depression. Make sure that you are up to date with all your vaccines. This information is not intended to replace advice given to you by your health care provider. Make sure you discuss any questions you have with your health care provider. Document Revised: 01/01/2018 Document Reviewed: 01/01/2018 Elsevier Patient Education  2020 [employer]    Breast Self-Awareness Breast self-awareness means being familiar with how your breasts look and feel. It involves checking your breasts regularly and reporting any changes to your health care provider. Practicing breast self-awareness is important. Sometimes changes may not be harmful (are benign), but sometimes a change in your breasts can be a sign of a serious medical problem. It is important to learn how to do this procedure correctly so that you can catch problems early, when treatment is more likely to be successful. All women should practice breast self-awareness, including women who have had breast implants. What you need:  A mirror.  A well-lit room. How to do a breast self-exam A breast self-exam is   one way to learn what is normal for your breasts and whether your breasts are changing. To do a breast self-exam: Look for changes  1. Remove all the clothing above your waist. 2. Stand in front of a mirror in a room with good lighting. 3. Put your hands on your hips. 4. Push your hands firmly downward. 5. Compare your breasts in the mirror. Look for differences between them (asymmetry), such as: ? Differences in shape. ? Differences in size. ? Puckers, dips, and bumps in one breast and not the other. 6. Look at each breast for changes in the skin, such as: ? Redness. ? Scaly  areas. 7. Look for changes in your nipples, such as: ? Discharge. ? Bleeding. ? Dimpling. ? Redness. ? A change in position. Feel for changes Carefully feel your breasts for lumps and changes. It is best to do this while lying on your back on the floor, and again while sitting or standing in the tub or shower with soapy water on your skin. Feel each breast in the following way: 1. Place the arm on the side of the breast you are examining above your head. 2. Feel your breast with the other hand. 3. Start in the nipple area and make -inch (2 cm) overlapping circles to feel your breast. Use the pads of your three middle fingers to do this. Apply light pressure, then medium pressure, then firm pressure. The light pressure will allow you to feel the tissue closest to the skin. The medium pressure will allow you to feel the tissue that is a little deeper. The firm pressure will allow you to feel the tissue close to the ribs. 4. Continue the overlapping circles, moving downward over the breast until you feel your ribs below your breast. 5. Move one finger-width toward the center of the body. Continue to use the -inch (2 cm) overlapping circles to feel your breast as you move slowly up toward your collarbone. 6. Continue the up-and-down exam using all three pressures until you reach your armpit.  Write down what you find Writing down what you find can help you remember what to discuss with your health care provider. Write down:  What is normal for each breast.  Any changes that you find in each breast, including: ? The kind of changes you find. ? Any pain or tenderness. ? Size and location of any lumps.  Where you are in your menstrual cycle, if you are still menstruating. General tips and recommendations  Examine your breasts every month.  If you are breastfeeding, the best time to examine your breasts is after a feeding or after using a breast pump.  If you menstruate, the best time to  examine your breasts is 5-7 days after your period. Breasts are generally lumpier during menstrual periods, and it may be more difficult to notice changes.  With time and practice, you will become more familiar with the variations in your breasts and more comfortable with the exam. Contact a health care provider if you:  See a change in the shape or size of your breasts or nipples.  See a change in the skin of your breast or nipples, such as a reddened or scaly area.  Have unusual discharge from your nipples.  Find a lump or thick area that was not there before.  Have pain in your breasts.  Have any concerns related to your breast health. Summary  Breast self-awareness includes looking for physical changes in your breasts, as well   as feeling for any changes within your breasts.  Breast self-awareness should be performed in front of a mirror in a well-lit room.  You should examine your breasts every month. If you menstruate, the best time to examine your breasts is 5-7 days after your menstrual period.  Let your health care provider know of any changes you notice in your breasts, including changes in size, changes on the skin, pain or tenderness, or unusual fluid from your nipples. This information is not intended to replace advice given to you by your health care provider. Make sure you discuss any questions you have with your health care provider. Document Revised: 08/27/2017 Document Reviewed: 08/27/2017 Elsevier Patient Education  2020 [employer]  

## 2019-08-12 NOTE — Progress Notes (Signed)
ANNUAL PREVENTATIVE CARE GYNECOLOGY  ENCOUNTER NOTE  Subjective:       Caitlin Washington is a 54 y.o. G67P2012 female here for a routine annual gynecologic exam. The patient is sexually active. The patient is taking hormone replacement therapy. Patient denies post-menopausal vaginal bleeding. The patient wears seatbelts: yes. The patient participates in regular exercise: no. Has the patient ever been transfused or tattooed?: no.   Current complaints: 1. Patient desires to know whether or not she should continue her HRT. She has been on current regimen for 6 years.  Once tried to quit cold [place] but notes quick return of symptoms.    Gynecologic History No LMP recorded. Patient has had a hysterectomy. Contraception: post menopausal status Last Pap: 03/28/2015. Results were: normal. Denies h/o abnormal pap smears.  Last mammogram: 02/2018. Results were: BIRADS 0 (calcifications in right breast), followed by diagnostic mammogram which was normal.  Last Colonoscopy: 06/2015.  Results were: internal hemorrhoids. Recommend repeat in 5 years.     Obstetric History OB History  Gravida Para Term Preterm AB Living  3 2 2   1 2   SAB TAB Ectopic Multiple Live Births  1       2    # Outcome Date GA Lbr Len/2nd Weight Sex Delivery Anes PTL Lv  3 Term 2005   6 lb 2.1 oz (2.781 kg) M Vag-Spont   LIV  2 Term 2001   5 lb 2.1 oz (2.327 kg) M Vag-Spont   LIV  1 SAB 2000            Past Medical History:  Diagnosis Date  . Anemia   . Anxiety   . Dyspareunia in female   . Hypertension   . Hypothyroidism   . PONV (postoperative nausea and vomiting)    after hysterectomy  . PVC (premature ventricular contraction)    controlled with metoprolol  . Sweating abnormality   . Thyroid disease   . Wears contact lenses     Family History  Problem Relation Age of Onset  . Hypertension Father   . Heart disease Maternal Grandmother   . Heart disease Paternal Grandfather   . Breast cancer Neg Hx      Past Surgical History:  Procedure Laterality Date  . ABDOMINAL HYSTERECTOMY    . COLONOSCOPY WITH PROPOFOL N/A 06/23/2015   Procedure: COLONOSCOPY WITH PROPOFOL;  Surgeon: Manya Silvas, MD;  Location: [facility] ENDOSCOPY;  Service: Endoscopy;  Laterality: N/A;  . FOOT SURGERY Right    buion excision  . HALLUX VALGUS AUSTIN Right 12/14/2015   Procedure: HALLUX VALGUS AUSTIN RIGHT FOOT;  Surgeon: [name], DPM;  Location: [facility];  Service: Podiatry;  Laterality: Right;  POPLITEAL  . TONSILLECTOMY    . TUBAL LIGATION      Social History   Socioeconomic History  . Marital status: Married    Spouse name: Not on file  . Number of children: Not on file  . Years of education: Not on file  . Highest education level: Not on file  Occupational History  . Not on file  Tobacco Use  . Smoking status: Never Smoker  . Smokeless tobacco: Never Used  Vaping Use  . Vaping Use: Never used  Substance and Sexual Activity  . Alcohol use: Yes    Alcohol/week: 0.0 standard drinks    Comment: 2 beers/month  . Drug use: No  . Sexual activity: Yes    Birth control/protection: Surgical  Other Topics Concern  .  Not on file  Social History Narrative  . Not on file   Social Determinants of Health   Financial Resource Strain:   . Difficulty of Paying Living Expenses:   Food Insecurity:   . Worried About Charity fundraiser in the Last Year:   . Arboriculturist in the Last Year:   Transportation Needs:   . Film/video editor (Medical):   Marland Kitchen Lack of Transportation (Non-Medical):   Physical Activity:   . Days of Exercise per Week:   . Minutes of Exercise per Session:   Stress:   . Feeling of Stress :   Social Connections:   . Frequency of Communication with Friends and Family:   . Frequency of Social Gatherings with Friends and Family:   . Attends Religious Services:   . Active Member of Clubs or Organizations:   . Attends Archivist Meetings:   Marland Kitchen Marital  Status:   Intimate Partner Violence:   . Fear of Current or Ex-Partner:   . Emotionally Abused:   Marland Kitchen Physically Abused:   . Sexually Abused:     Current Outpatient Medications on File Prior to Visit  Medication Sig Dispense Refill  . ALPRAZolam (XANAX) 0.25 MG tablet TAKE 1 TABLET BY MOUTH TWICE A DAY AS NEEDED FOR ANXIETY 60 tablet 2  . CALCIUM PO Take by mouth.    . citalopram (CELEXA) 10 MG tablet TAKE 1 TABLET BY MOUTH ONCE DAILY (Patient taking differently: two tablets daily) 90 tablet 3  . cyanocobalamin (,VITAMIN B-12,) 1000 MCG/ML injection Inject 1 mL (1,000 mcg total) into the muscle once. 1 mL 0  . fluticasone (FLONASE) 50 MCG/ACT nasal spray USE TWO SPRAYS IN EACH NOSTRIL ONCE DAILY 16 g 2  . levothyroxine (SYNTHROID, LEVOTHROID) 88 MCG tablet Take 88 mcg by mouth daily before breakfast.    . pantoprazole (PROTONIX) 20 MG tablet Take 20 mg by mouth daily.     No current facility-administered medications on file prior to visit.    Allergies  Allergen Reactions  . Sulfa Antibiotics Hives  . Amoxicillin Rash  . Penicillins Rash      Review of Systems ROS Review of Systems - General ROS: negative for - chills, fatigue, fever, hot flashes, night sweats, weight gain or weight loss Psychological ROS: negative for - anxiety, decreased libido, depression, mood swings, physical abuse or sexual abuse Ophthalmic ROS: negative for - blurry vision, eye pain or loss of vision ENT ROS: negative for - headaches, hearing change, visual changes or vocal changes Allergy and Immunology ROS: negative for - hives, itchy/watery eyes or seasonal allergies Hematological and Lymphatic ROS: negative for - bleeding problems, bruising, swollen lymph nodes or weight loss Endocrine ROS: negative for - galactorrhea, hair pattern changes, hot flashes, malaise/lethargy, mood swings, palpitations, polydipsia/polyuria, skin changes, temperature intolerance or unexpected weight changes Breast ROS:  negative for - new or changing breast lumps or nipple discharge Respiratory ROS: negative for - cough or shortness of breath Cardiovascular ROS: negative for - chest pain, irregular heartbeat, palpitations or shortness of breath Gastrointestinal ROS: no abdominal pain, change in bowel habits, or black or bloody stools Genito-Urinary ROS: no dysuria, trouble voiding, or hematuria Musculoskeletal ROS: negative for - joint pain or joint stiffness Neurological ROS: negative for - bowel and bladder control changes Dermatological ROS: negative for rash and skin lesion changes   Objective:   BP 103/69   Pulse 71   Ht 5\' 7"  (1.702 m)   Wt 163  lb 14.4 oz (74.3 kg)   BMI 25.67 kg/m  CONSTITUTIONAL: Well-developed, well-nourished female in no acute distress.  PSYCHIATRIC: Normal mood and affect. Normal behavior. Normal judgment and thought content. [place]: Alert and oriented to person, place, and time. Normal muscle tone coordination. No cranial nerve deficit noted. HENT:  Normocephalic, atraumatic, External right and left ear normal. Oropharynx is clear and moist EYES: Conjunctivae and EOM are normal. Pupils are equal, round, and reactive to light. No scleral icterus.  NECK: Normal range of motion, supple, no masses.  Normal thyroid.  SKIN: Skin is warm and dry. No rash noted. Not diaphoretic. No erythema. No pallor. CARDIOVASCULAR: Normal heart rate noted, regular rhythm, no murmur. RESPIRATORY: Clear to auscultation bilaterally. Effort and breath sounds normal, no problems with respiration noted. BREASTS: Symmetric in size. No masses, skin changes, nipple drainage, or lymphadenopathy. ABDOMEN: Soft, normal bowel sounds, no distention noted.  No tenderness, rebound or guarding.  BLADDER: Normal PELVIC:  Bladder no bladder distension noted  Urethra: normal appearing urethra with no masses, tenderness or lesions  Vulva: normal appearing vulva with no masses, tenderness or lesions  Vagina:  normal appearing vagina with normal color and discharge, no lesions  Cervix: surgically absent  Uterus: surgically absent, vaginal cuff well healed  Adnexa: normal adnexa in size, nontender and no masses  RV: External Exam NormaI, No Rectal Masses and Normal Sphincter tone  MUSCULOSKELETAL: Normal range of motion. No tenderness.  No cyanosis, clubbing, or edema.  2+ distal pulses. LYMPHATIC: No Axillary, Supraclavicular, or Inguinal Adenopathy.   Labs: Results reviewed in Care Everywhere  Assessment:   1. Encounter for well woman exam with routine gynecological exam   2. Breast cancer screening by mammogram   3. Post-menopause on HRT (hormone replacement therapy)   4. Anxiety, generalized   5. Hypothyroidism (acquired)    Plan:  Pap: Not needed. Patient is s/p hysterectomy.  Mammogram: Ordered Stool Guaiac Testing:  Not Ordered. Up to date on colonoscopy. Will be due to repeat in 1 year.  Labs: None ordered.  Routine preventative health maintenance measures emphasized: Exercise/Diet/Weight control, Tobacco Warnings, Alcohol/Substance use risks and Stress Management.  Anxiety currently managed on Xanax and Celexa.  Menopausal on HRT, desires to discuss weaning. Advised to decrease to 1 patch per week (currently using 2 patches per week) for 2-3 weeks, then can stop use. Refill given on patches.  Hypothyroidism managed by PCP.  Patient has completed COVID vaccination series.  Return to [place], MD  [facility]

## 2019-08-13 DIAGNOSIS — M9901 Segmental and somatic dysfunction of cervical region: Secondary | ICD-10-CM | POA: Diagnosis not present

## 2019-08-13 DIAGNOSIS — M62838 Other muscle spasm: Secondary | ICD-10-CM | POA: Diagnosis not present

## 2019-08-13 DIAGNOSIS — M5413 Radiculopathy, cervicothoracic region: Secondary | ICD-10-CM | POA: Diagnosis not present

## 2019-08-17 DIAGNOSIS — E538 Deficiency of other specified B group vitamins: Secondary | ICD-10-CM | POA: Diagnosis not present

## 2019-08-17 DIAGNOSIS — M9901 Segmental and somatic dysfunction of cervical region: Secondary | ICD-10-CM | POA: Diagnosis not present

## 2019-08-17 DIAGNOSIS — M62838 Other muscle spasm: Secondary | ICD-10-CM | POA: Diagnosis not present

## 2019-08-17 DIAGNOSIS — M5413 Radiculopathy, cervicothoracic region: Secondary | ICD-10-CM | POA: Diagnosis not present

## 2019-08-24 DIAGNOSIS — G4733 Obstructive sleep apnea (adult) (pediatric): Secondary | ICD-10-CM | POA: Diagnosis not present

## 2019-09-01 DIAGNOSIS — M62838 Other muscle spasm: Secondary | ICD-10-CM | POA: Diagnosis not present

## 2019-09-01 DIAGNOSIS — M9901 Segmental and somatic dysfunction of cervical region: Secondary | ICD-10-CM | POA: Diagnosis not present

## 2019-09-01 DIAGNOSIS — M5413 Radiculopathy, cervicothoracic region: Secondary | ICD-10-CM | POA: Diagnosis not present

## 2019-09-02 DIAGNOSIS — I493 Ventricular premature depolarization: Secondary | ICD-10-CM | POA: Diagnosis not present

## 2019-09-02 DIAGNOSIS — K219 Gastro-esophageal reflux disease without esophagitis: Secondary | ICD-10-CM | POA: Diagnosis not present

## 2019-09-02 DIAGNOSIS — E039 Hypothyroidism, unspecified: Secondary | ICD-10-CM | POA: Diagnosis not present

## 2019-09-02 DIAGNOSIS — F411 Generalized anxiety disorder: Secondary | ICD-10-CM | POA: Diagnosis not present

## 2019-09-02 DIAGNOSIS — E538 Deficiency of other specified B group vitamins: Secondary | ICD-10-CM | POA: Diagnosis not present

## 2019-09-02 DIAGNOSIS — G4733 Obstructive sleep apnea (adult) (pediatric): Secondary | ICD-10-CM | POA: Diagnosis not present

## 2019-09-07 DIAGNOSIS — I493 Ventricular premature depolarization: Secondary | ICD-10-CM | POA: Diagnosis not present

## 2019-09-07 DIAGNOSIS — Z0000 Encounter for general adult medical examination without abnormal findings: Secondary | ICD-10-CM | POA: Diagnosis not present

## 2019-09-07 DIAGNOSIS — G4733 Obstructive sleep apnea (adult) (pediatric): Secondary | ICD-10-CM | POA: Diagnosis not present

## 2019-09-07 DIAGNOSIS — E039 Hypothyroidism, unspecified: Secondary | ICD-10-CM | POA: Diagnosis not present

## 2019-09-07 DIAGNOSIS — Z23 Encounter for immunization: Secondary | ICD-10-CM | POA: Diagnosis not present

## 2019-09-25 DIAGNOSIS — E538 Deficiency of other specified B group vitamins: Secondary | ICD-10-CM | POA: Diagnosis not present

## 2019-10-27 DIAGNOSIS — E538 Deficiency of other specified B group vitamins: Secondary | ICD-10-CM | POA: Diagnosis not present

## 2019-11-10 ENCOUNTER — Inpatient Hospital Stay: Admission: RE | Admit: 2019-11-10 | Payer: 59 | Source: Ambulatory Visit

## 2019-11-19 ENCOUNTER — Other Ambulatory Visit: Payer: Self-pay

## 2019-11-19 ENCOUNTER — Ambulatory Visit
Admission: RE | Admit: 2019-11-19 | Discharge: 2019-11-19 | Disposition: A | Discharge status: Home / Self Care | Payer: 59 | Source: Ambulatory Visit | Attending: Obstetrics and Gynecology | Admitting: Obstetrics and Gynecology

## 2019-11-19 DIAGNOSIS — Z1231 Encounter for screening mammogram for malignant neoplasm of breast: Secondary | ICD-10-CM | POA: Diagnosis not present

## 2019-11-27 DIAGNOSIS — E538 Deficiency of other specified B group vitamins: Secondary | ICD-10-CM | POA: Diagnosis not present

## 2019-12-23 ENCOUNTER — Other Ambulatory Visit: Payer: Self-pay | Admitting: Internal Medicine

## 2019-12-23 DIAGNOSIS — H5213 Myopia, bilateral: Secondary | ICD-10-CM | POA: Diagnosis not present

## 2019-12-24 DIAGNOSIS — G4733 Obstructive sleep apnea (adult) (pediatric): Secondary | ICD-10-CM | POA: Diagnosis not present

## 2019-12-28 DIAGNOSIS — M67814 Other specified disorders of tendon, left shoulder: Secondary | ICD-10-CM | POA: Diagnosis not present

## 2019-12-28 DIAGNOSIS — M5412 Radiculopathy, cervical region: Secondary | ICD-10-CM | POA: Diagnosis not present

## 2020-01-12 DIAGNOSIS — M67814 Other specified disorders of tendon, left shoulder: Secondary | ICD-10-CM | POA: Diagnosis not present

## 2020-01-12 DIAGNOSIS — M5412 Radiculopathy, cervical region: Secondary | ICD-10-CM | POA: Diagnosis not present

## 2020-01-29 DIAGNOSIS — M5412 Radiculopathy, cervical region: Secondary | ICD-10-CM | POA: Diagnosis not present

## 2020-01-29 DIAGNOSIS — G5603 Carpal tunnel syndrome, bilateral upper limbs: Secondary | ICD-10-CM | POA: Diagnosis not present

## 2020-02-01 DIAGNOSIS — M7542 Impingement syndrome of left shoulder: Secondary | ICD-10-CM | POA: Diagnosis not present

## 2020-02-01 DIAGNOSIS — M75112 Incomplete rotator cuff tear or rupture of left shoulder, not specified as traumatic: Secondary | ICD-10-CM | POA: Diagnosis not present

## 2020-02-15 ENCOUNTER — Other Ambulatory Visit: Payer: Self-pay | Admitting: Internal Medicine

## 2020-03-02 DIAGNOSIS — Z0000 Encounter for general adult medical examination without abnormal findings: Secondary | ICD-10-CM | POA: Diagnosis not present

## 2020-03-02 DIAGNOSIS — E039 Hypothyroidism, unspecified: Secondary | ICD-10-CM | POA: Diagnosis not present

## 2020-03-02 DIAGNOSIS — G4733 Obstructive sleep apnea (adult) (pediatric): Secondary | ICD-10-CM | POA: Diagnosis not present

## 2020-03-02 DIAGNOSIS — I493 Ventricular premature depolarization: Secondary | ICD-10-CM | POA: Diagnosis not present

## 2020-03-09 DIAGNOSIS — E538 Deficiency of other specified B group vitamins: Secondary | ICD-10-CM | POA: Diagnosis not present

## 2020-03-09 DIAGNOSIS — E039 Hypothyroidism, unspecified: Secondary | ICD-10-CM | POA: Diagnosis not present

## 2020-03-09 DIAGNOSIS — G4733 Obstructive sleep apnea (adult) (pediatric): Secondary | ICD-10-CM | POA: Diagnosis not present

## 2020-03-09 DIAGNOSIS — F411 Generalized anxiety disorder: Secondary | ICD-10-CM | POA: Diagnosis not present

## 2020-03-09 DIAGNOSIS — Z1159 Encounter for screening for other viral diseases: Secondary | ICD-10-CM | POA: Diagnosis not present

## 2020-03-29 ENCOUNTER — Other Ambulatory Visit (HOSPITAL_COMMUNITY): Payer: Self-pay | Admitting: Pharmacist

## 2020-04-05 ENCOUNTER — Other Ambulatory Visit: Payer: Self-pay | Admitting: Podiatry

## 2020-04-05 DIAGNOSIS — M79671 Pain in right foot: Secondary | ICD-10-CM | POA: Diagnosis not present

## 2020-04-05 DIAGNOSIS — D2372 Other benign neoplasm of skin of left lower limb, including hip: Secondary | ICD-10-CM | POA: Diagnosis not present

## 2020-04-05 DIAGNOSIS — L03032 Cellulitis of left toe: Secondary | ICD-10-CM | POA: Diagnosis not present

## 2020-04-07 ENCOUNTER — Other Ambulatory Visit: Payer: Self-pay | Admitting: Dermatology

## 2020-04-07 DIAGNOSIS — L821 Other seborrheic keratosis: Secondary | ICD-10-CM | POA: Diagnosis not present

## 2020-04-07 DIAGNOSIS — L568 Other specified acute skin changes due to ultraviolet radiation: Secondary | ICD-10-CM | POA: Diagnosis not present

## 2020-04-11 DIAGNOSIS — E538 Deficiency of other specified B group vitamins: Secondary | ICD-10-CM | POA: Diagnosis not present

## 2020-05-06 ENCOUNTER — Other Ambulatory Visit: Payer: Self-pay

## 2020-05-06 DIAGNOSIS — G4733 Obstructive sleep apnea (adult) (pediatric): Secondary | ICD-10-CM | POA: Diagnosis not present

## 2020-05-06 MED FILL — Fluticasone Propionate Nasal Susp 50 MCG/ACT: NASAL | 30 days supply | Qty: 16 | Fill #0 | Status: AC

## 2020-05-09 ENCOUNTER — Other Ambulatory Visit: Payer: Self-pay

## 2020-05-09 MED ORDER — LEVOTHYROXINE SODIUM 88 MCG PO TABS
88.0000 ug | ORAL_TABLET | Freq: Every day | ORAL | 3 refills | Status: DC
  Filled 2020-05-09: qty 90, 90d supply, fill #0
  Filled 2020-08-23: qty 90, 90d supply, fill #1
  Filled 2020-11-17: qty 90, 90d supply, fill #2
  Filled 2021-02-26: qty 90, 90d supply, fill #3

## 2020-05-10 ENCOUNTER — Other Ambulatory Visit: Payer: Self-pay

## 2020-05-12 DIAGNOSIS — E538 Deficiency of other specified B group vitamins: Secondary | ICD-10-CM | POA: Diagnosis not present

## 2020-06-05 MED FILL — Estradiol TD Patch Twice Weekly 0.05 MG/24HR: TRANSDERMAL | 28 days supply | Qty: 8 | Fill #0 | Status: AC

## 2020-06-06 ENCOUNTER — Other Ambulatory Visit: Payer: Self-pay

## 2020-06-06 ENCOUNTER — Other Ambulatory Visit: Payer: Self-pay | Admitting: Unknown Physician Specialty

## 2020-06-07 ENCOUNTER — Other Ambulatory Visit: Payer: Self-pay

## 2020-06-08 ENCOUNTER — Other Ambulatory Visit: Payer: Self-pay

## 2020-06-09 ENCOUNTER — Other Ambulatory Visit: Payer: Self-pay

## 2020-06-09 ENCOUNTER — Other Ambulatory Visit: Payer: Self-pay | Admitting: Unknown Physician Specialty

## 2020-06-10 ENCOUNTER — Other Ambulatory Visit: Payer: Self-pay

## 2020-06-13 ENCOUNTER — Other Ambulatory Visit: Payer: Self-pay

## 2020-06-13 DIAGNOSIS — E538 Deficiency of other specified B group vitamins: Secondary | ICD-10-CM | POA: Diagnosis not present

## 2020-06-16 ENCOUNTER — Other Ambulatory Visit: Payer: Self-pay

## 2020-06-27 ENCOUNTER — Other Ambulatory Visit: Payer: Self-pay

## 2020-06-29 ENCOUNTER — Other Ambulatory Visit: Payer: Self-pay

## 2020-06-29 MED ORDER — CITALOPRAM HYDROBROMIDE 20 MG PO TABS
ORAL_TABLET | ORAL | 1 refills | Status: DC
  Filled 2020-06-29: qty 90, 90d supply, fill #0

## 2020-06-29 MED FILL — Estradiol TD Patch Twice Weekly 0.05 MG/24HR: TRANSDERMAL | 28 days supply | Qty: 8 | Fill #1 | Status: AC

## 2020-06-30 ENCOUNTER — Other Ambulatory Visit: Payer: Self-pay

## 2020-07-04 ENCOUNTER — Other Ambulatory Visit: Payer: Self-pay

## 2020-07-05 ENCOUNTER — Other Ambulatory Visit: Payer: Self-pay

## 2020-07-13 DIAGNOSIS — L821 Other seborrheic keratosis: Secondary | ICD-10-CM | POA: Diagnosis not present

## 2020-07-13 DIAGNOSIS — L570 Actinic keratosis: Secondary | ICD-10-CM | POA: Diagnosis not present

## 2020-07-13 DIAGNOSIS — D225 Melanocytic nevi of trunk: Secondary | ICD-10-CM | POA: Diagnosis not present

## 2020-07-13 DIAGNOSIS — X32XXXA Exposure to sunlight, initial encounter: Secondary | ICD-10-CM | POA: Diagnosis not present

## 2020-07-13 DIAGNOSIS — D2261 Melanocytic nevi of right upper limb, including shoulder: Secondary | ICD-10-CM | POA: Diagnosis not present

## 2020-07-13 DIAGNOSIS — D2262 Melanocytic nevi of left upper limb, including shoulder: Secondary | ICD-10-CM | POA: Diagnosis not present

## 2020-07-13 DIAGNOSIS — D2271 Melanocytic nevi of right lower limb, including hip: Secondary | ICD-10-CM | POA: Diagnosis not present

## 2020-07-13 DIAGNOSIS — D2272 Melanocytic nevi of left lower limb, including hip: Secondary | ICD-10-CM | POA: Diagnosis not present

## 2020-07-19 ENCOUNTER — Other Ambulatory Visit: Payer: Self-pay | Admitting: Internal Medicine

## 2020-07-19 DIAGNOSIS — Z1231 Encounter for screening mammogram for malignant neoplasm of breast: Secondary | ICD-10-CM

## 2020-07-21 DIAGNOSIS — E039 Hypothyroidism, unspecified: Secondary | ICD-10-CM | POA: Diagnosis not present

## 2020-07-21 DIAGNOSIS — Z79899 Other long term (current) drug therapy: Secondary | ICD-10-CM | POA: Diagnosis not present

## 2020-07-21 DIAGNOSIS — F411 Generalized anxiety disorder: Secondary | ICD-10-CM | POA: Diagnosis not present

## 2020-07-21 DIAGNOSIS — D649 Anemia, unspecified: Secondary | ICD-10-CM | POA: Diagnosis not present

## 2020-07-21 DIAGNOSIS — G4733 Obstructive sleep apnea (adult) (pediatric): Secondary | ICD-10-CM | POA: Diagnosis not present

## 2020-07-21 DIAGNOSIS — Z9989 Dependence on other enabling machines and devices: Secondary | ICD-10-CM | POA: Diagnosis not present

## 2020-07-21 DIAGNOSIS — E538 Deficiency of other specified B group vitamins: Secondary | ICD-10-CM | POA: Diagnosis not present

## 2020-07-21 DIAGNOSIS — G2581 Restless legs syndrome: Secondary | ICD-10-CM | POA: Diagnosis not present

## 2020-07-21 DIAGNOSIS — M79651 Pain in right thigh: Secondary | ICD-10-CM | POA: Diagnosis not present

## 2020-07-21 DIAGNOSIS — M79652 Pain in left thigh: Secondary | ICD-10-CM | POA: Diagnosis not present

## 2020-07-22 ENCOUNTER — Other Ambulatory Visit: Payer: Self-pay

## 2020-07-22 MED ORDER — ROPINIROLE HCL 0.5 MG PO TABS
ORAL_TABLET | ORAL | 0 refills | Status: DC
  Filled 2020-07-22: qty 90, 90d supply, fill #0

## 2020-07-22 MED ORDER — POLYSACCHARIDE IRON COMPLEX 150 MG PO CAPS
ORAL_CAPSULE | ORAL | 1 refills | Status: DC
  Filled 2020-07-22: qty 90, 90d supply, fill #0

## 2020-08-11 ENCOUNTER — Other Ambulatory Visit: Payer: Self-pay

## 2020-08-11 MED ORDER — PAXLOVID 20 X 150 MG & 10 X 100MG PO TBPK
ORAL_TABLET | ORAL | 0 refills | Status: DC
  Filled 2020-08-11: qty 30, 5d supply, fill #0

## 2020-08-11 MED ORDER — PREDNISONE 10 MG PO TABS
ORAL_TABLET | ORAL | 0 refills | Status: DC
  Filled 2020-08-11: qty 12, 6d supply, fill #0

## 2020-08-15 ENCOUNTER — Other Ambulatory Visit: Payer: Self-pay

## 2020-08-15 ENCOUNTER — Telehealth: Payer: Self-pay | Admitting: Certified Nurse Midwife

## 2020-08-15 MED ORDER — ESTRADIOL 0.05 MG/24HR TD PTTW
MEDICATED_PATCH | TRANSDERMAL | 0 refills | Status: DC
  Filled 2020-08-15: qty 3, 10d supply, fill #0
  Filled 2020-08-15: qty 8, 28d supply, fill #0

## 2020-08-15 NOTE — Telephone Encounter (Signed)
Pt had to cancel her annual due to positive covid test- pt did notice that she needs refill on estrogen patch to get her to her next apt which was rescheduled for 09-06-2020. Please Advise  Pharm: [facility]

## 2020-08-15 NOTE — Telephone Encounter (Signed)
Pharmacy called, the estradiol patches come in a box of 8, they are unable to open a box and take out 3 patches. I told pharmacy to go ahead and sent out 1 box.

## 2020-08-16 ENCOUNTER — Encounter: Payer: Self-pay | Admitting: Obstetrics and Gynecology

## 2020-08-18 ENCOUNTER — Other Ambulatory Visit: Payer: Self-pay

## 2020-08-23 ENCOUNTER — Other Ambulatory Visit: Payer: Self-pay

## 2020-09-06 ENCOUNTER — Ambulatory Visit (INDEPENDENT_AMBULATORY_CARE_PROVIDER_SITE_OTHER): Payer: BC Managed Care – PPO | Admitting: Certified Nurse Midwife

## 2020-09-06 ENCOUNTER — Encounter: Payer: Self-pay | Admitting: Certified Nurse Midwife

## 2020-09-06 ENCOUNTER — Other Ambulatory Visit: Payer: Self-pay

## 2020-09-06 VITALS — BP 116/78 | HR 69 | Ht 67.0 in | Wt 161.0 lb

## 2020-09-06 DIAGNOSIS — Z01419 Encounter for gynecological examination (general) (routine) without abnormal findings: Secondary | ICD-10-CM

## 2020-09-06 MED ORDER — ESTRADIOL 0.05 MG/24HR TD PTTW
MEDICATED_PATCH | TRANSDERMAL | 11 refills | Status: DC
  Filled 2020-09-06 – 2020-09-20 (×2): qty 8, 28d supply, fill #0
  Filled 2020-10-16 – 2020-10-17 (×2): qty 8, 28d supply, fill #1
  Filled 2020-11-14: qty 8, 28d supply, fill #2
  Filled 2020-12-14: qty 8, 28d supply, fill #3
  Filled 2021-01-24: qty 8, 28d supply, fill #4
  Filled 2021-02-26: qty 8, 28d supply, fill #5
  Filled 2021-04-15: qty 8, 28d supply, fill #6
  Filled 2021-06-06: qty 8, 28d supply, fill #7
  Filled 2021-07-07: qty 8, 28d supply, fill #8
  Filled 2021-08-24: qty 8, 28d supply, fill #9

## 2020-09-06 NOTE — Progress Notes (Signed)
GYNECOLOGY ANNUAL PREVENTATIVE CARE ENCOUNTER NOTE  History:     Caitlin Washington is a 55 y.o. G34P2012 female here for a routine annual gynecologic exam.  Current complaints: low libido.   Denies abnormal vaginal bleeding, discharge, pelvic pain, problems with intercourse or other gynecologic concerns.     Social Relationship: married ( 4 children ) 4 yr old lives with her Living: spouse, and 43 yr old Work: Administrator, sports  Exercise: Walking Smoke/Alcohol/drug use: denies   Gynecologic History No LMP recorded. Patient has had a hysterectomy. Contraception: status post hysterectomy Last Pap: 03/2015. Results were: normal  Last mammogram: 11/14/2019. Results were: normal  Obstetric History OB History  Gravida Para Term Preterm AB Living  _0 SAB IAB Ectopic Multiple Live Births  1       2    # Outcome Date GA Lbr Len/2nd Weight Sex Delivery Anes PTL Lv  3 Term 2005   6 lb 2.1 oz (2.781 kg) M Vag-Spont   LIV  2 Term 2001   5 lb 2.1 oz (2.327 kg) M Vag-Spont   LIV  1 SAB 2000            Past Medical History:  Diagnosis Date   Anemia    Anxiety    Dyspareunia in female    Hypertension    Hypothyroidism    PONV (postoperative nausea and vomiting)    after hysterectomy   PVC (premature ventricular contraction)    controlled with metoprolol   Sweating abnormality    Thyroid disease    Wears contact lenses     Past Surgical History:  Procedure Laterality Date   ABDOMINAL HYSTERECTOMY     COLONOSCOPY WITH PROPOFOL N/A 06/23/2015   Procedure: COLONOSCOPY WITH PROPOFOL;  Surgeon: Manya Silvas, MD;  Location: [facility] ENDOSCOPY;  Service: Endoscopy;  Laterality: N/A;   FOOT SURGERY Right    buion excision   HALLUX VALGUS [place] Right 12/14/2015   Procedure: HALLUX VALGUS [place] RIGHT FOOT;  Surgeon: [name], DPM;  Location: [facility];  Service: Podiatry;  Laterality: Right;  POPLITEAL   TONSILLECTOMY     TUBAL LIGATION       Current Outpatient Medications on File Prior to Visit  Medication Sig Dispense Refill   ALPRAZolam (XANAX) 0.25 MG tablet TAKE 1 TABLET BY MOUTH TWICE A DAY AS NEEDED FOR ANXIETY 60 tablet 2   CALCIUM PO Take by mouth.     ciclopirox (PENLAC) 8 % solution APPLY TOPICALLY NIGHTLY APPLY OVER NAIL AND SURROUNDING SKIN. 6.6 mL 3   citalopram (CELEXA) 20 MG tablet TAKE 1 TABLET (20 MG TOTAL) BY MOUTH ONCE DAILY 90 tablet 1   citalopram (CELEXA) 20 MG tablet Take 1 tablet (20 mg total) by mouth once daily 90 tablet 1   clotrimazole (LOTRIMIN) 1 % cream APPLY TWICE DAILY TO CORNERS OF MOUTH AS NEEDED 14 g 0   COVID-19 At Home Antigen Test KIT USE AS DIRECTED 4 kit 0   cyanocobalamin (,VITAMIN B-12,) 1000 MCG/ML injection Inject 1 mL (1,000 mcg total) into the muscle once. 1 mL 0   estradiol (VIVELLE-DOT) 0.05 MG/24HR patch PLACE 1 PATCH (0.05 MG TOTAL) ONTO THE SKIN 2 (TWO) TIMES A WEEK. 8 patch 0   fluorouracil (EFUDEX) 5 % cream APPLY TWICE DAILY TO LOWER LIP FOR 3 DAYS 40 g 0   fluticasone (FLONASE) 50 MCG/ACT nasal spray USE TWO SPRAYS IN EACH NOSTRIL ONCE DAILY 16  g 2   iron polysaccharides (NIFEREX) 150 MG capsule Take 1 capsule (150 mg total) by mouth once daily 90 capsule 1   levothyroxine (SYNTHROID) 88 MCG tablet TAKE 1 TABLET BY MOUTH ONCE DAILY ON AN EMPTY STOMACH WITH A GLASS OF WATER AT LEAST 30-60 MINUTES BEFORE BREAKFAST. 90 tablet 3   levothyroxine (SYNTHROID, LEVOTHROID) 88 MCG tablet Take 88 mcg by mouth daily before breakfast.     Nirmatrelvir & Ritonavir (PAXLOVID) 20 x 150 MG & 10 x 100MG TBPK Take 2 (two) pink tablets (300 mg nirmatrelvir) with 1 (one) tablet (100 mg ritonavir) by mouth twice a day for 5 days. 30 each 0   pantoprazole (PROTONIX) 20 MG tablet Take 20 mg by mouth daily.     pantoprazole (PROTONIX) 40 MG tablet TAKE 1 TABLET BY MOUTH TWO TIMES DAILY 180 tablet 1   predniSONE (DELTASONE) 10 MG tablet Take 3 tablets (30 mg total) by mouth once daily for 2 days,  THEN 2 tablets (20 mg total) once daily for 2 days, THEN 1 tablet (10 mg total) once daily for 2 days. 12 tablet 0   rOPINIRole (REQUIP) 0.5 MG tablet Take 1 tablet (0.5 mg total) by mouth nightly for 90 days 90 tablet 0   citalopram (CELEXA) 10 MG tablet TAKE 1 TABLET BY MOUTH ONCE DAILY (Patient not taking: Reported on 09/06/2020) 90 tablet 3   fluticasone (FLONASE) 50 MCG/ACT nasal spray INSTILL 2 SPRAYS IN EACH NOSTRIL DAILY 16 g 10   No current facility-administered medications on file prior to visit.    Allergies  Allergen Reactions   Sulfa Antibiotics Hives   Amoxicillin Rash   Penicillins Rash    Social History:  reports that she has never smoked. She has never used smokeless tobacco. She reports current alcohol use. She reports that she does not use drugs.  Family History  Problem Relation Age of Onset   Hypertension Father    Heart disease Maternal Grandmother    Heart disease Paternal Grandfather    Breast cancer Neg Hx     The following portions of the patient's history were reviewed and updated as appropriate: allergies, current medications, past family history, past medical history, past social history, past surgical history and problem list.  Review of Systems Pertinent items noted in HPI and remainder of comprehensive ROS otherwise negative.  Physical Exam:  There were no vitals taken for this visit. CONSTITUTIONAL: Well-developed, well-nourished female in no acute distress.  HENT:  Normocephalic, atraumatic, External right and left ear normal. Oropharynx is clear and moist EYES: Conjunctivae and EOM are normal. Pupils are equal, round, and reactive to light. No scleral icterus.  NECK: Normal range of motion, supple, no masses.  Normal thyroid.  SKIN: Skin is warm and dry. No rash noted. Not diaphoretic. No erythema. No pallor. MUSCULOSKELETAL: Normal range of motion. No tenderness.  No cyanosis, clubbing, or edema.  2+ distal pulses. NEUROLOGIC: Alert and  oriented to person, place, and time. Normal reflexes, muscle tone coordination.  PSYCHIATRIC: Normal mood and affect. Normal behavior. Normal judgment and thought content. CARDIOVASCULAR: Normal heart rate noted, regular rhythm RESPIRATORY: Clear to auscultation bilaterally. Effort and breath sounds normal, no problems with respiration noted. BREASTS: Symmetric in size. No masses, tenderness, skin changes, nipple drainage, or lymphadenopathy bilaterally.  ABDOMEN: Soft, no distention noted.  No tenderness, rebound or guarding.  PELVIC: Normal appearing external genitalia and urethral meatus; normal appearing vaginal mucosa.  No abnormal discharge noted.  Pap smear not indicated .  Uterine absent, no other palpable masses, no  adnexal tenderness.  .   Assessment and Plan:    1. Well woman exam with routine gynecological exam  [id number] Mammogram : scheduled 10/2020 Labs: none due Refills: estradiol patch  Referral: none Routine preventative health maintenance measures emphasized. Please refer to After Visit Summary for other counseling recommendations.      Philip Aspen, CNM [facility]

## 2020-09-12 ENCOUNTER — Inpatient Hospital Stay: Payer: BC Managed Care – PPO | Attending: Internal Medicine | Admitting: Internal Medicine

## 2020-09-12 ENCOUNTER — Inpatient Hospital Stay: Payer: BC Managed Care – PPO

## 2020-09-12 ENCOUNTER — Other Ambulatory Visit: Payer: Self-pay

## 2020-09-12 DIAGNOSIS — D649 Anemia, unspecified: Secondary | ICD-10-CM | POA: Diagnosis not present

## 2020-09-12 MED ORDER — POLYSACCHARIDE IRON COMPLEX 150 MG PO CAPS
ORAL_CAPSULE | ORAL | 1 refills | Status: DC
  Filled 2020-09-12: qty 180, 90d supply, fill #0

## 2020-09-12 NOTE — Progress Notes (Signed)
[name] NOTE  Patient Care Team: Tracie Harrier, MD as PCP - General (Internal Medicine)  CHIEF COMPLAINTS/PURPOSE OF CONSULTATION: ANEMIA   HEMATOLOGY HISTORY:  # AUG 2022 ANEMIA-hemoglobin 10 ferritin 7 [PCP]; 201- EGD/Colo-NEG [Dr.Elliot].   #Mild intermittent leukopenia [since 2021]-August 2022-ANC 1.3 [PCP]-asymptomatic.  HISTORY OF PRESENTING ILLNESS:  Caitlin Washington 55 y.o.  female has been referred to [place] for further evaluation/work-up for anemia.  Patient complains of worsening fatigue/cramping in the legs.  Patient had a prior history of iron deficiency almost 5 years ago when she received iron infusion x1.  At the time patient had EGD/colonoscopy.  Few months ago patient donated blood 2 units.  However in the summer patient noted to have worsening fatigue/cramping in the legs.  Patient was started on IV iron supplementation was noted to have low ferritin levels.  Iron supplementation causes constipation.    Patient notes to have bluish discoloration of her fingertips in the winter months in the last few years.  This has not been clinically evaluated.  Her mother has Raynaud's.  Blood in stools: None Change in bowel habits- None Blood in urine: None Difficulty swallowing: None Abnormal weight loss: None Iron supplementation: Niferex -constipation Prior Blood transfusions:  Bariatric surgery: None EGD/Colonoscopy: remote Vaginal bleeding: None  Review of Systems  Constitutional:  Positive for malaise/fatigue. Negative for chills, diaphoresis, fever and weight loss.  HENT:  Negative for nosebleeds and sore throat.   Eyes:  Negative for double vision.  Respiratory:  Negative for cough, hemoptysis, sputum production, shortness of breath and wheezing.   Cardiovascular:  Negative for chest pain, palpitations, orthopnea and leg swelling.  Gastrointestinal:  Negative for abdominal pain, blood in stool, constipation, diarrhea, heartburn,  melena, nausea and vomiting.  Genitourinary:  Negative for dysuria, frequency and urgency.  Musculoskeletal:  Positive for joint pain and myalgias.  Skin: Negative.  Negative for itching and rash.  Neurological:  Negative for dizziness, tingling, focal weakness, weakness and headaches.  Endo/Heme/Allergies:  Does not bruise/bleed easily.  Psychiatric/Behavioral:  Negative for depression. The patient is not nervous/anxious and does not have insomnia.    MEDICAL HISTORY:  Past Medical History:  Diagnosis Date   Anemia    Anxiety    Dyspareunia in female    Hypertension    Hypothyroidism    PONV (postoperative nausea and vomiting)    after hysterectomy   PVC (premature ventricular contraction)    controlled with metoprolol   Sweating abnormality    Thyroid disease    Wears contact lenses     SURGICAL HISTORY: Past Surgical History:  Procedure Laterality Date   ABDOMINAL HYSTERECTOMY     COLONOSCOPY WITH PROPOFOL N/A 06/23/2015   Procedure: COLONOSCOPY WITH PROPOFOL;  Surgeon: Manya Silvas, MD;  Location: [facility];  Service: Endoscopy;  Laterality: N/A;   FOOT SURGERY Right    buion excision   HALLUX VALGUS AUSTIN Right 12/14/2015   Procedure: HALLUX VALGUS AUSTIN RIGHT FOOT;  Surgeon: [name], DPM;  Location: [facility];  Service: Podiatry;  Laterality: Right;  POPLITEAL   TONSILLECTOMY     TUBAL LIGATION      SOCIAL HISTORY: Social History   Socioeconomic History   Marital status: Married    Spouse name: Not on file   Number of children: Not on file   Years of education: Not on file   Highest education level: Not on file  Occupational History   Not on file  Tobacco Use  Smoking status: Never   Smokeless tobacco: Never  Vaping Use   Vaping Use: Never used  Substance and Sexual Activity   Alcohol use: Yes    Alcohol/week: 0.0 standard drinks    Comment: 2 beers/month   Drug use: No   Sexual activity: Yes    Birth control/protection:  Surgical  Other Topics Concern   Not on file  Social History Narrative   Not on file   Social Determinants of Health   Financial Resource Strain: Not on file  Food Insecurity: Not on file  Transportation Needs: Not on file  Physical Activity: Not on file  Stress: Not on file  Social Connections: Not on file  Intimate Partner Violence: Not on file    FAMILY HISTORY: Family History  Problem Relation Age of Onset   Hypertension Father    Heart disease Maternal Grandmother    Heart disease Paternal Grandfather    Breast cancer Neg Hx     ALLERGIES:  is allergic to sulfa antibiotics, amoxicillin, and penicillins.  MEDICATIONS:  Current Outpatient Medications  Medication Sig Dispense Refill   CALCIUM PO Take by mouth.     citalopram (CELEXA) 10 MG tablet TAKE 1 TABLET BY MOUTH ONCE DAILY 90 tablet 3   clotrimazole (LOTRIMIN) 1 % cream APPLY TWICE DAILY TO CORNERS OF MOUTH AS NEEDED 14 g 0   cyanocobalamin (,VITAMIN B-12,) 1000 MCG/ML injection Inject 1 mL (1,000 mcg total) into the muscle once. 1 mL 0   estradiol (VIVELLE-DOT) 0.05 MG/24HR patch PLACE 1 PATCH (0.05 MG TOTAL) ONTO THE SKIN 2 (TWO) TIMES A WEEK. 8 patch 11   fluticasone (FLONASE) 50 MCG/ACT nasal spray USE TWO SPRAYS IN EACH NOSTRIL ONCE DAILY 16 g 2   iron polysaccharides (NIFEREX) 150 MG capsule Take 1 capsule (150 mg total) by mouth 2 (two) times daily for 180 days 180 capsule 1   levothyroxine (SYNTHROID, LEVOTHROID) 88 MCG tablet Take 88 mcg by mouth daily before breakfast.     pantoprazole (PROTONIX) 20 MG tablet Take 20 mg by mouth daily.     ALPRAZolam (XANAX) 0.25 MG tablet TAKE 1 TABLET BY MOUTH TWICE A DAY AS NEEDED FOR ANXIETY (Patient not taking: Reported on 09/12/2020) 60 tablet 2   No current facility-administered medications for this visit.      PHYSICAL EXAMINATION:   Vitals:   09/12/20 1400  BP: 114/72  Pulse: 79  Resp: 20  Temp: 98.9 F (37.2 C)   There were no vitals filed for this  visit.  Physical Exam Vitals and nursing note reviewed.  HENT:     Head: Normocephalic and atraumatic.     Mouth/Throat:     Pharynx: Oropharynx is clear.  Eyes:     Extraocular Movements: Extraocular movements intact.     Pupils: Pupils are equal, round, and reactive to light.  Cardiovascular:     Rate and Rhythm: Normal rate and regular rhythm.  Pulmonary:     Comments: Decreased breath sounds bilaterally.  Abdominal:     Palpations: Abdomen is soft.  Musculoskeletal:        General: Normal range of motion.     Cervical back: Normal range of motion.  Skin:    General: Skin is warm.  Neurological:     General: No focal deficit present.     Mental Status: She is alert and oriented to person, place, and time.  Psychiatric:        Behavior: Behavior normal.  Judgment: Judgment normal.    LABORATORY DATA:  I have reviewed the data as listed Lab Results  Component Value Date   WBC 5.3 12/11/2013   HGB 14.1 12/11/2013   HCT 42.8 12/11/2013   MCV 93 12/11/2013   PLT 188 12/11/2013   No results for input(s): NA, K, CL, CO2, GLUCOSE, BUN, CREATININE, CALCIUM, GFRNONAA, GFRAA, PROT, ALBUMIN, AST, ALT, ALKPHOS, BILITOT, BILIDIR, IBILI in the last 8760 hours.   No results found.  Symptomatic anemia # Anemia- AUG 2022-hemoglobin 9.8 iron deficiency- hemoglobin: ferritin: 7.  Poor tolerance to p.o. iron/constipation.  # Patient is symptomatic from worsening fatigue-recommend IV iron infusions. Discussed the potential acute infusion reactions with IV iron; which are quite rare.  Patient understands the risk; will proceed with infusions.  #Etiology: EGD/colo [remote]; await repeat EGD/Colo  # Slightly low white count: Intermittent/ ANC 1.2.  Possible etiologies include autoimmune/reactive/medication.  Clinically less likely any malignant causes.  Patient is asymptomatic.  Hold off on bone marrow biopsy.  Recommend follow-up subsequent visit.  #Chronic neck-negative  work-up. ? Early raynauds [mom-Ray]-we will follow; above work-up  # DISPOSITION: # no labs today # venofer weekly x3 # follow up in 2 months- MD; labs-cbc/cmp;LDH. ANA; CRP; peripheral smear tech review-Dr.B  Thank you,Dr.Hande for allowing me to participate in the care of your pleasant patient. Please do not hesitate to contact me with questions or concerns in the interim.   All questions were answered. The patient knows to call the clinic with any problems, questions or concerns.   Cammie Sickle, MD 09/12/2020 2:46 PM

## 2020-09-12 NOTE — Assessment & Plan Note (Addendum)
#  Anemia- AUG 2022-hemoglobin 9.8 iron deficiency- hemoglobin: ferritin: 7.  Poor tolerance to p.o. iron/constipation.  # Patient is symptomatic from worsening fatigue-recommend IV iron infusions. Discussed the potential acute infusion reactions with IV iron; which are quite rare.  Patient understands the risk; will proceed with infusions.  #Etiology: EGD/colo [remote]; await repeat EGD/Colo  # Slightly low white count: Intermittent/ ANC 1.2.  Possible etiologies include autoimmune/reactive/medication.  Clinically less likely any malignant causes.  Patient is asymptomatic.  Hold off on bone marrow biopsy.  Recommend follow-up subsequent visit.  #Chronic neck-negative work-up. ? Early raynauds [mom-Ray]-we will follow; above work-up  # DISPOSITION: # no labs today # venofer weekly x3 # follow up in 2 months- MD; labs-cbc/cmp;LDH. ANA; CRP; peripheral smear tech review-Dr.B  Thank you,Dr.Hande for allowing me to participate in the care of your pleasant patient. Please do not hesitate to contact me with questions or concerns in the interim.

## 2020-09-16 ENCOUNTER — Other Ambulatory Visit: Payer: Self-pay

## 2020-09-19 ENCOUNTER — Other Ambulatory Visit: Payer: Self-pay

## 2020-09-20 ENCOUNTER — Other Ambulatory Visit: Payer: Self-pay

## 2020-09-21 ENCOUNTER — Inpatient Hospital Stay: Payer: BC Managed Care – PPO

## 2020-09-28 ENCOUNTER — Encounter: Payer: Self-pay | Admitting: *Deleted

## 2020-09-28 ENCOUNTER — Other Ambulatory Visit: Payer: Self-pay | Admitting: *Deleted

## 2020-09-28 ENCOUNTER — Telehealth: Payer: Self-pay | Admitting: *Deleted

## 2020-09-28 ENCOUNTER — Ambulatory Visit: Payer: BC Managed Care – PPO

## 2020-09-28 DIAGNOSIS — D649 Anemia, unspecified: Secondary | ICD-10-CM

## 2020-09-28 NOTE — Telephone Encounter (Signed)
Ok to add lab encounter next week (pt's preference)

## 2020-09-28 NOTE — Telephone Encounter (Signed)
Patient called stating that since she had to postpone her iron injections due to husband having COVID, her PCP had her to double up on her oral iron tnbs. She is scheduled to come in for infusion Wednesday next week, but she is asking if she can have her labs rechecked first because her legs are not aching like they were and she wants to be sure she still needs IV iron before getting it. Please advise

## 2020-09-28 NOTE — Telephone Encounter (Signed)
Ok to r/s pt's apts. Patient will need to test as well for covid- given her exposure. If positive, then she will need to quarantine. I will send her a Comptroller

## 2020-10-04 ENCOUNTER — Inpatient Hospital Stay: Payer: BC Managed Care – PPO | Attending: Internal Medicine

## 2020-10-04 DIAGNOSIS — R5383 Other fatigue: Secondary | ICD-10-CM | POA: Diagnosis not present

## 2020-10-04 DIAGNOSIS — Z8249 Family history of ischemic heart disease and other diseases of the circulatory system: Secondary | ICD-10-CM | POA: Diagnosis not present

## 2020-10-04 DIAGNOSIS — M791 Myalgia, unspecified site: Secondary | ICD-10-CM | POA: Insufficient documentation

## 2020-10-04 DIAGNOSIS — M2550 Pain in unspecified joint: Secondary | ICD-10-CM | POA: Insufficient documentation

## 2020-10-04 DIAGNOSIS — D649 Anemia, unspecified: Secondary | ICD-10-CM | POA: Diagnosis not present

## 2020-10-04 DIAGNOSIS — D72819 Decreased white blood cell count, unspecified: Secondary | ICD-10-CM | POA: Diagnosis not present

## 2020-10-04 DIAGNOSIS — Z79899 Other long term (current) drug therapy: Secondary | ICD-10-CM | POA: Insufficient documentation

## 2020-10-04 LAB — CBC WITH DIFFERENTIAL/PLATELET
Abs Immature Granulocytes: 0.01 10*3/uL (ref 0.00–0.07)
Basophils Absolute: 0.1 10*3/uL (ref 0.0–0.1)
Basophils Relative: 1 %
Eosinophils Absolute: 0.1 10*3/uL (ref 0.0–0.5)
Eosinophils Relative: 2 %
HCT: 38.7 % (ref 36.0–46.0)
Hemoglobin: 12 g/dL (ref 12.0–15.0)
Immature Granulocytes: 0 %
Lymphocytes Relative: 30 %
Lymphs Abs: 1.4 10*3/uL (ref 0.7–4.0)
MCH: 26.4 pg (ref 26.0–34.0)
MCHC: 31 g/dL (ref 30.0–36.0)
MCV: 85.2 fL (ref 80.0–100.0)
Monocytes Absolute: 0.3 10*3/uL (ref 0.1–1.0)
Monocytes Relative: 6 %
Neutro Abs: 2.8 10*3/uL (ref 1.7–7.7)
Neutrophils Relative %: 61 %
Platelets: 185 10*3/uL (ref 150–400)
RBC: 4.54 MIL/uL (ref 3.87–5.11)
RDW: 19 % — ABNORMAL HIGH (ref 11.5–15.5)
WBC: 4.7 10*3/uL (ref 4.0–10.5)
nRBC: 0 % (ref 0.0–0.2)

## 2020-10-04 LAB — IRON AND TIBC
Iron: 40 ug/dL (ref 28–170)
Saturation Ratios: 8 % — ABNORMAL LOW (ref 10.4–31.8)
TIBC: 489 ug/dL — ABNORMAL HIGH (ref 250–450)
UIBC: 449 ug/dL

## 2020-10-04 LAB — FERRITIN: Ferritin: 5 ng/mL — ABNORMAL LOW (ref 11–307)

## 2020-10-05 ENCOUNTER — Other Ambulatory Visit: Payer: BC Managed Care – PPO

## 2020-10-05 ENCOUNTER — Other Ambulatory Visit: Payer: Self-pay

## 2020-10-05 ENCOUNTER — Inpatient Hospital Stay: Payer: BC Managed Care – PPO

## 2020-10-05 VITALS — BP 120/73 | HR 69 | Temp 97.6°F | Resp 18

## 2020-10-05 DIAGNOSIS — D649 Anemia, unspecified: Secondary | ICD-10-CM | POA: Diagnosis not present

## 2020-10-05 MED ORDER — SODIUM CHLORIDE 0.9 % IV SOLN
Freq: Once | INTRAVENOUS | Status: AC
  Filled 2020-10-05: qty 250

## 2020-10-05 MED ORDER — IRON SUCROSE 20 MG/ML IV SOLN
200.0000 mg | Freq: Once | INTRAVENOUS | Status: AC
  Administered 2020-10-05: 200 mg via INTRAVENOUS
  Filled 2020-10-05: qty 10

## 2020-10-05 MED ORDER — SODIUM CHLORIDE 0.9 % IV SOLN: 200.0000 mg | Freq: Once | INTRAVENOUS | Status: DC

## 2020-10-05 NOTE — Patient Instructions (Signed)
[facility]   Discharge Instructions: Thank you for choosing [facility] to provide your oncology and hematology care.  If you have a lab appointment with the Cancer Center, please go directly to the Cancer Center and check in at the registration area.  We strive to give you quality time with your provider. You may need to reschedule your appointment if you arrive late (15 or more minutes).  Arriving late affects you and other patients whose appointments are after yours.  Also, if you miss three or more appointments without notifying the office, you may be dismissed from the clinic at the provider's discretion.      For prescription refill requests, have your pharmacy contact our office and allow 72 hours for refills to be completed.    Today you received the following: Venofer.      BELOW ARE SYMPTOMS THAT SHOULD BE REPORTED IMMEDIATELY: *FEVER GREATER THAN 100.4 F (38 C) OR HIGHER *CHILLS OR SWEATING *NAUSEA AND VOMITING THAT IS NOT CONTROLLED WITH YOUR NAUSEA MEDICATION *UNUSUAL SHORTNESS OF BREATH *UNUSUAL BRUISING OR BLEEDING *URINARY PROBLEMS (pain or burning when urinating, or frequent urination) *BOWEL PROBLEMS (unusual diarrhea, constipation, pain near the anus) TENDERNESS IN MOUTH AND THROAT WITH OR WITHOUT PRESENCE OF ULCERS (sore throat, sores in mouth, or a toothache) UNUSUAL RASH, SWELLING OR PAIN  UNUSUAL VAGINAL DISCHARGE OR ITCHING   Items with * indicate a potential emergency and should be followed up as soon as possible or go to the Emergency Department if any problems should occur.  Should you have questions after your visit or need to cancel or reschedule your appointment, please contact [facility]  [phone] and follow the prompts.  Office hours are 8:00 a.m. to 4:30 p.m. Monday - Friday. Please note that voicemails left after 4:00 p.m. may not be returned until the following  business day.  We are closed weekends and major holidays. You have access to a nurse at all times for urgent questions. Please call the main number to the clinic [phone] and follow the prompts.  For any non-urgent questions, you may also contact your provider using MyChart. We now offer e-Visits for anyone 18 and older to request care online for non-urgent symptoms. For details visit [email or web address].   Also download the MyChart app! Go to the app store, search "MyChart", open the app, select [facility], and log in with your MyChart username and password.  Due to Covid, a mask is required upon entering the hospital/clinic. If you do not have a mask, one will be given to you upon arrival. For doctor visits, patients may have 1 support person aged 18 or older with them. For treatment visits, patients cannot have anyone with them due to current Covid guidelines and our immunocompromised population.  

## 2020-10-12 ENCOUNTER — Inpatient Hospital Stay: Payer: BC Managed Care – PPO

## 2020-10-12 DIAGNOSIS — D649 Anemia, unspecified: Secondary | ICD-10-CM

## 2020-10-12 MED ORDER — SODIUM CHLORIDE 0.9 % IV SOLN
Freq: Once | INTRAVENOUS | Status: AC
  Filled 2020-10-12: qty 250

## 2020-10-12 MED ORDER — SODIUM CHLORIDE 0.9 % IV SOLN: 200.0000 mg | Freq: Once | INTRAVENOUS | Status: DC

## 2020-10-12 MED ORDER — IRON SUCROSE 20 MG/ML IV SOLN
200.0000 mg | Freq: Once | INTRAVENOUS | Status: AC
  Administered 2020-10-12: 200 mg via INTRAVENOUS
  Filled 2020-10-12: qty 10

## 2020-10-12 NOTE — Patient Instructions (Signed)

## 2020-10-17 ENCOUNTER — Other Ambulatory Visit: Payer: Self-pay

## 2020-10-19 ENCOUNTER — Inpatient Hospital Stay: Payer: BC Managed Care – PPO

## 2020-10-19 VITALS — BP 124/57 | HR 62 | Temp 96.0°F | Resp 18

## 2020-10-19 DIAGNOSIS — D649 Anemia, unspecified: Secondary | ICD-10-CM

## 2020-10-19 MED ORDER — SODIUM CHLORIDE 0.9 % IV SOLN
Freq: Once | INTRAVENOUS | Status: AC
Start: 2020-10-19 — End: 2020-10-19
  Filled 2020-10-19: qty 250

## 2020-10-19 MED ORDER — SODIUM CHLORIDE 0.9 % IV SOLN: 200.0000 mg | Freq: Once | INTRAVENOUS | Status: DC

## 2020-10-19 MED ORDER — IRON SUCROSE 20 MG/ML IV SOLN
200.0000 mg | Freq: Once | INTRAVENOUS | Status: AC
  Administered 2020-10-19: 200 mg via INTRAVENOUS
  Filled 2020-10-19: qty 10

## 2020-10-19 NOTE — Patient Instructions (Signed)

## 2020-10-24 ENCOUNTER — Encounter: Payer: Self-pay | Admitting: Internal Medicine

## 2020-10-31 ENCOUNTER — Other Ambulatory Visit: Payer: Self-pay

## 2020-11-01 ENCOUNTER — Other Ambulatory Visit: Payer: Self-pay

## 2020-11-03 ENCOUNTER — Other Ambulatory Visit: Payer: Self-pay

## 2020-11-03 MED ORDER — PANTOPRAZOLE SODIUM 40 MG PO TBEC
DELAYED_RELEASE_TABLET | Freq: Two times a day (BID) | ORAL | 1 refills | Status: DC
  Filled 2020-11-03: qty 180, 90d supply, fill #0

## 2020-11-04 ENCOUNTER — Inpatient Hospital Stay: Payer: BC Managed Care – PPO | Attending: Internal Medicine

## 2020-11-04 ENCOUNTER — Other Ambulatory Visit: Payer: Self-pay | Admitting: *Deleted

## 2020-11-04 DIAGNOSIS — R5381 Other malaise: Secondary | ICD-10-CM | POA: Insufficient documentation

## 2020-11-04 DIAGNOSIS — D649 Anemia, unspecified: Secondary | ICD-10-CM

## 2020-11-04 DIAGNOSIS — E538 Deficiency of other specified B group vitamins: Secondary | ICD-10-CM | POA: Insufficient documentation

## 2020-11-04 DIAGNOSIS — D72819 Decreased white blood cell count, unspecified: Secondary | ICD-10-CM | POA: Diagnosis not present

## 2020-11-04 DIAGNOSIS — R5383 Other fatigue: Secondary | ICD-10-CM | POA: Diagnosis not present

## 2020-11-04 DIAGNOSIS — M2550 Pain in unspecified joint: Secondary | ICD-10-CM | POA: Insufficient documentation

## 2020-11-04 DIAGNOSIS — D509 Iron deficiency anemia, unspecified: Secondary | ICD-10-CM | POA: Insufficient documentation

## 2020-11-04 DIAGNOSIS — M791 Myalgia, unspecified site: Secondary | ICD-10-CM | POA: Diagnosis not present

## 2020-11-04 DIAGNOSIS — E039 Hypothyroidism, unspecified: Secondary | ICD-10-CM | POA: Diagnosis not present

## 2020-11-04 LAB — TECHNOLOGIST SMEAR REVIEW: Plt Morphology: ADEQUATE

## 2020-11-04 LAB — CBC WITH DIFFERENTIAL/PLATELET
Abs Immature Granulocytes: 0.01 10*3/uL (ref 0.00–0.07)
Basophils Absolute: 0 10*3/uL (ref 0.0–0.1)
Basophils Relative: 1 %
Eosinophils Absolute: 0.1 10*3/uL (ref 0.0–0.5)
Eosinophils Relative: 2 %
HCT: 38.8 % (ref 36.0–46.0)
Hemoglobin: 12.5 g/dL (ref 12.0–15.0)
Immature Granulocytes: 0 %
Lymphocytes Relative: 32 %
Lymphs Abs: 1.4 10*3/uL (ref 0.7–4.0)
MCH: 28.3 pg (ref 26.0–34.0)
MCHC: 32.2 g/dL (ref 30.0–36.0)
MCV: 88 fL (ref 80.0–100.0)
Monocytes Absolute: 0.3 10*3/uL (ref 0.1–1.0)
Monocytes Relative: 6 %
Neutro Abs: 2.7 10*3/uL (ref 1.7–7.7)
Neutrophils Relative %: 59 %
Platelets: 184 10*3/uL (ref 150–400)
RBC: 4.41 MIL/uL (ref 3.87–5.11)
RDW: 18.1 % — ABNORMAL HIGH (ref 11.5–15.5)
WBC: 4.5 10*3/uL (ref 4.0–10.5)
nRBC: 0 % (ref 0.0–0.2)

## 2020-11-04 LAB — COMPREHENSIVE METABOLIC PANEL
ALT: 20 U/L (ref 0–44)
AST: 21 U/L (ref 15–41)
Albumin: 4.2 g/dL (ref 3.5–5.0)
Alkaline Phosphatase: 60 U/L (ref 38–126)
Anion gap: 6 (ref 5–15)
BUN: 12 mg/dL (ref 6–20)
CO2: 29 mmol/L (ref 22–32)
Calcium: 8.9 mg/dL (ref 8.9–10.3)
Chloride: 99 mmol/L (ref 98–111)
Creatinine, Ser: 0.71 mg/dL (ref 0.44–1.00)
GFR, Estimated: >60 mL/min (ref >60)
Glucose, Bld: 103 mg/dL — ABNORMAL HIGH (ref 70–99)
Potassium: 3.8 mmol/L (ref 3.5–5.1)
Sodium: 134 mmol/L — ABNORMAL LOW (ref 135–145)
Total Bilirubin: 0.4 mg/dL (ref 0.3–1.2)
Total Protein: 7.3 g/dL (ref 6.5–8.1)

## 2020-11-04 LAB — LACTATE DEHYDROGENASE: LDH: 108 U/L (ref 98–192)

## 2020-11-04 LAB — C-REACTIVE PROTEIN: CRP: 0.8 mg/dL (ref <1.0)

## 2020-11-07 ENCOUNTER — Inpatient Hospital Stay (HOSPITAL_BASED_OUTPATIENT_CLINIC_OR_DEPARTMENT_OTHER): Payer: BC Managed Care – PPO | Admitting: Internal Medicine

## 2020-11-07 ENCOUNTER — Encounter: Payer: Self-pay | Admitting: Internal Medicine

## 2020-11-07 ENCOUNTER — Other Ambulatory Visit: Payer: Self-pay

## 2020-11-07 DIAGNOSIS — D649 Anemia, unspecified: Secondary | ICD-10-CM | POA: Diagnosis not present

## 2020-11-07 DIAGNOSIS — D509 Iron deficiency anemia, unspecified: Secondary | ICD-10-CM | POA: Diagnosis not present

## 2020-11-07 LAB — ANA: Anti Nuclear Antibody (ANA): POSITIVE — AB

## 2020-11-07 NOTE — Progress Notes (Signed)
Pt here for anemia follow up. States that she feels better. Ferritin was not drawn on last labs.

## 2020-11-07 NOTE — Progress Notes (Signed)
[facility] NOTE  Patient Care Team: Tracie Harrier, MD as PCP - General (Internal Medicine)  CHIEF COMPLAINTS/PURPOSE OF CONSULTATION: ANEMIA   HEMATOLOGY HISTORY:  # AUG 2022 ANEMIA-hemoglobin 10 ferritin 7 [PCP]; 2017- EGD/Colo-NEG [Dr.Elliot].  P.o. and constipation.  S/p IV iron infusion.  # ?  B12 deficiency B12 injections. [pcp]  #Mild intermittent leukopenia [since 2021]-August 2022-ANC 1.3 [PCP]-asymptomatic.  HISTORY OF PRESENTING ILLNESS: Alone.  Ambulating independently.  Caitlin Washington 55 y.o.  female iron deficient anemia of unclear etiology is here for follow-up.  Notes to have improvement of energy levels.  Denies any tingling or numbness.  Review of Systems  Constitutional:  Positive for malaise/fatigue. Negative for chills, diaphoresis, fever and weight loss.  HENT:  Negative for nosebleeds and sore throat.   Eyes:  Negative for double vision.  Respiratory:  Negative for cough, hemoptysis, sputum production, shortness of breath and wheezing.   Cardiovascular:  Negative for chest pain, palpitations, orthopnea and leg swelling.  Gastrointestinal:  Negative for abdominal pain, blood in stool, constipation, diarrhea, heartburn, melena, nausea and vomiting.  Genitourinary:  Negative for dysuria, frequency and urgency.  Musculoskeletal:  Positive for joint pain and myalgias.  Skin: Negative.  Negative for itching and rash.  Neurological:  Negative for dizziness, tingling, focal weakness, weakness and headaches.  Endo/Heme/Allergies:  Does not bruise/bleed easily.  Psychiatric/Behavioral:  Negative for depression. The patient is not nervous/anxious and does not have insomnia.    MEDICAL HISTORY:  Past Medical History:  Diagnosis Date   Anemia    Anxiety    Dyspareunia in female    Hypertension    Hypothyroidism    PONV (postoperative nausea and vomiting)    after hysterectomy   PVC (premature ventricular contraction)     controlled with metoprolol   Sweating abnormality    Thyroid disease    Wears contact lenses     SURGICAL HISTORY: Past Surgical History:  Procedure Laterality Date   ABDOMINAL HYSTERECTOMY     COLONOSCOPY WITH PROPOFOL N/A 06/23/2015   Procedure: COLONOSCOPY WITH PROPOFOL;  Surgeon: Manya Silvas, MD;  Location: [place];  Service: Endoscopy;  Laterality: N/A;   FOOT SURGERY Right    buion excision   HALLUX VALGUS AUSTIN Right 12/14/2015   Procedure: HALLUX VALGUS AUSTIN RIGHT FOOT;  Surgeon: [name], DPM;  Location: [place] [facility];  Service: Podiatry;  Laterality: Right;  POPLITEAL   TONSILLECTOMY     TUBAL LIGATION      SOCIAL HISTORY: Social History   Socioeconomic History   Marital status: Married    Spouse name: Not on file   Number of children: Not on file   Years of education: Not on file   Highest education level: Not on file  Occupational History   Not on file  Tobacco Use   Smoking status: Never   Smokeless tobacco: Never  Vaping Use   Vaping Use: Never used  Substance and Sexual Activity   Alcohol use: Yes    Alcohol/week: 0.0 standard drinks    Comment: 2 beers/month   Drug use: No   Sexual activity: Yes    Birth control/protection: Surgical  Other Topics Concern   Not on file  Social History Narrative   Not on file   Social Determinants of Health   Financial Resource Strain: Not on file  Food Insecurity: Not on file  Transportation Needs: Not on file  Physical Activity: Not on file  Stress: Not on file  Social  Connections: Not on file  Intimate Partner Violence: Not on file    FAMILY HISTORY: Family History  Problem Relation Age of Onset   Hypertension Father    Heart disease Maternal Grandmother    Heart disease Paternal Grandfather    Breast cancer Neg Hx     ALLERGIES:  is allergic to sulfa antibiotics, amoxicillin, and penicillins.  MEDICATIONS:  Current Outpatient Medications  Medication Sig Dispense Refill    ALPRAZolam (XANAX) 0.25 MG tablet TAKE 1 TABLET BY MOUTH TWICE A DAY AS NEEDED FOR ANXIETY 60 tablet 2   CALCIUM PO Take by mouth.     citalopram (CELEXA) 10 MG tablet TAKE 1 TABLET BY MOUTH ONCE DAILY 90 tablet 3   clotrimazole (LOTRIMIN) 1 % cream APPLY TWICE DAILY TO CORNERS OF MOUTH AS NEEDED 14 g 0   cyanocobalamin (,VITAMIN B-12,) 1000 MCG/ML injection Inject 1 mL (1,000 mcg total) into the muscle once. 1 mL 0   estradiol (VIVELLE-DOT) 0.05 MG/24HR patch PLACE 1 PATCH (0.05 MG TOTAL) ONTO THE SKIN 2 (TWO) TIMES A WEEK. 8 patch 11   fluticasone (FLONASE) 50 MCG/ACT nasal spray USE TWO SPRAYS IN EACH NOSTRIL ONCE DAILY 16 g 2   levothyroxine (SYNTHROID, LEVOTHROID) 88 MCG tablet Take 88 mcg by mouth daily before breakfast.     pantoprazole (PROTONIX) 20 MG tablet Take 20 mg by mouth daily.     No current facility-administered medications for this visit.      PHYSICAL EXAMINATION:   Vitals:   11/07/20 1037  BP: 126/83  Pulse: 81  Resp: 18  Temp: 97.6 F (36.4 C)  SpO2: 100%   Filed Weights   11/07/20 1037  Weight: 170 lb 1.6 oz (77.2 kg)    Physical Exam Vitals and nursing note reviewed.  HENT:     Head: Normocephalic and atraumatic.     Mouth/Throat:     Pharynx: Oropharynx is clear.  Eyes:     Extraocular Movements: Extraocular movements intact.     Pupils: Pupils are equal, round, and reactive to light.  Cardiovascular:     Rate and Rhythm: Normal rate and regular rhythm.  Pulmonary:     Comments: Decreased breath sounds bilaterally.  Abdominal:     Palpations: Abdomen is soft.  Musculoskeletal:        General: Normal range of motion.     Cervical back: Normal range of motion.  Skin:    General: Skin is warm.  Neurological:     General: No focal deficit present.     Mental Status: She is alert and oriented to person, place, and time.  Psychiatric:        Behavior: Behavior normal.        Judgment: Judgment normal.    LABORATORY DATA:  I have  reviewed the data as listed Lab Results  Component Value Date   WBC 4.5 11/04/2020   HGB 12.5 11/04/2020   HCT 38.8 11/04/2020   MCV 88.0 11/04/2020   PLT 184 11/04/2020   Recent Labs    11/04/20 1441  NA 134*  K 3.8  CL 99  CO2 29  GLUCOSE 103*  BUN 12  CREATININE 0.71  CALCIUM 8.9  GFRNONAA >60  PROT 7.3  ALBUMIN 4.2  AST 21  ALT 20  ALKPHOS 60  BILITOT 0.4     No results found.  Symptomatic anemia # Anemia- AUG 2022-hemoglobin 9.8 iron deficiency- hemoglobin: ferritin: 7.  Ps/p- IV Venofer hemoglobin is 12.2.  Hold off on  infusion today.  #Etiology: EGD/colo [remote]; awaiting repeat GI evaluation/EGD/Colo.   # Slightly low white count: Intermittent/ ANC 1.2.  Improved.  Possible etiologies include autoimmune/reactive/medication- STABLE.   # Chronic neck-negative work-up. ? Early raynauds [mom-Ray]-we will follow; above work-up. Defer to Dr.Hande  # DISPOSITION: # follow up in  6 weeks-  MD; labs-cbc/cmp;iron studies/ ferritin- possible venofer- Dr.B   All questions were answered. The patient knows to call the clinic with any problems, questions or concerns.   Cammie Sickle, MD 11/07/2020 1:04 PM

## 2020-11-07 NOTE — Assessment & Plan Note (Addendum)
#   Anemia- AUG 2022-hemoglobin 9.8 iron deficiency- hemoglobin: ferritin: 7.  Ps/p- IV Venofer hemoglobin is 12.2.  Hold off on infusion today.  #Etiology: EGD/colo [remote]; awaiting repeat GI evaluation/EGD/Colo.   # Slightly low white count: Intermittent/ ANC 1.2.  Improved.  Possible etiologies include autoimmune/reactive/medication- STABLE.   # Chronic neck-negative work-up. ? Early raynauds [mom-Ray]-we will follow; above work-up. Defer to Dr.Hande  # DISPOSITION: # follow up in  6 weeks-  MD; labs-cbc/cmp;iron studies/ ferritin- possible venofer- Dr.B

## 2020-11-08 ENCOUNTER — Encounter: Payer: Self-pay | Admitting: Internal Medicine

## 2020-11-08 NOTE — Progress Notes (Signed)
On October 18 I spoke to patient regarding results of positive ANA.  Heather/Colette please make a referral to Dr. Patel/[facility]heumatology [facility] regarding positive ANA.

## 2020-11-09 ENCOUNTER — Telehealth: Payer: Self-pay | Admitting: *Deleted

## 2020-11-09 DIAGNOSIS — R768 Other specified abnormal immunological findings in serum: Secondary | ICD-10-CM

## 2020-11-09 NOTE — Telephone Encounter (Signed)
Aubriel Khanna/Colette please make a referral to Dr. Patel/[facility]umatology [facility] regarding positive ANA.

## 2020-11-09 NOTE — Telephone Encounter (Signed)
REFERRAL - faxed to rheumatology/ Dr. Serita Grit office at [facility]

## 2020-11-15 ENCOUNTER — Other Ambulatory Visit: Payer: Self-pay

## 2020-11-17 ENCOUNTER — Other Ambulatory Visit: Payer: Self-pay

## 2020-11-21 ENCOUNTER — Other Ambulatory Visit: Payer: Self-pay

## 2020-11-21 ENCOUNTER — Ambulatory Visit
Admission: RE | Admit: 2020-11-21 | Discharge: 2020-11-21 | Disposition: A | Discharge status: Home / Self Care | Payer: BC Managed Care – PPO | Source: Ambulatory Visit | Attending: Internal Medicine | Admitting: Internal Medicine

## 2020-11-21 DIAGNOSIS — Z1231 Encounter for screening mammogram for malignant neoplasm of breast: Secondary | ICD-10-CM | POA: Insufficient documentation

## 2020-12-14 ENCOUNTER — Other Ambulatory Visit: Payer: Self-pay

## 2020-12-22 ENCOUNTER — Inpatient Hospital Stay: Payer: BC Managed Care – PPO

## 2020-12-22 ENCOUNTER — Encounter: Payer: Self-pay | Admitting: Internal Medicine

## 2020-12-22 ENCOUNTER — Inpatient Hospital Stay: Payer: BC Managed Care – PPO | Attending: Internal Medicine

## 2020-12-22 ENCOUNTER — Inpatient Hospital Stay: Payer: BC Managed Care – PPO | Attending: Internal Medicine | Admitting: Internal Medicine

## 2020-12-22 ENCOUNTER — Other Ambulatory Visit: Payer: Self-pay

## 2020-12-22 DIAGNOSIS — D509 Iron deficiency anemia, unspecified: Secondary | ICD-10-CM | POA: Insufficient documentation

## 2020-12-22 DIAGNOSIS — D649 Anemia, unspecified: Secondary | ICD-10-CM

## 2020-12-22 LAB — CBC WITH DIFFERENTIAL/PLATELET
Abs Immature Granulocytes: 0.01 10*3/uL (ref 0.00–0.07)
Basophils Absolute: 0 10*3/uL (ref 0.0–0.1)
Basophils Relative: 1 %
Eosinophils Absolute: 0.1 10*3/uL (ref 0.0–0.5)
Eosinophils Relative: 2 %
HCT: 41.9 % (ref 36.0–46.0)
Hemoglobin: 13.7 g/dL (ref 12.0–15.0)
Immature Granulocytes: 0 %
Lymphocytes Relative: 36 %
Lymphs Abs: 1.2 10*3/uL (ref 0.7–4.0)
MCH: 29.7 pg (ref 26.0–34.0)
MCHC: 32.7 g/dL (ref 30.0–36.0)
MCV: 90.9 fL (ref 80.0–100.0)
Monocytes Absolute: 0.2 10*3/uL (ref 0.1–1.0)
Monocytes Relative: 7 %
Neutro Abs: 1.8 10*3/uL (ref 1.7–7.7)
Neutrophils Relative %: 54 %
Platelets: 166 10*3/uL (ref 150–400)
RBC: 4.61 MIL/uL (ref 3.87–5.11)
RDW: 14.1 % (ref 11.5–15.5)
WBC: 3.2 10*3/uL — ABNORMAL LOW (ref 4.0–10.5)
nRBC: 0 % (ref 0.0–0.2)

## 2020-12-22 LAB — COMPREHENSIVE METABOLIC PANEL
ALT: 19 U/L (ref 0–44)
AST: 22 U/L (ref 15–41)
Albumin: 4.4 g/dL (ref 3.5–5.0)
Alkaline Phosphatase: 51 U/L (ref 38–126)
Anion gap: 9 (ref 5–15)
BUN: 13 mg/dL (ref 6–20)
CO2: 27 mmol/L (ref 22–32)
Calcium: 8.9 mg/dL (ref 8.9–10.3)
Chloride: 101 mmol/L (ref 98–111)
Creatinine, Ser: 0.74 mg/dL (ref 0.44–1.00)
GFR, Estimated: >60 mL/min (ref >60)
Glucose, Bld: 115 mg/dL — ABNORMAL HIGH (ref 70–99)
Potassium: 4.4 mmol/L (ref 3.5–5.1)
Sodium: 137 mmol/L (ref 135–145)
Total Bilirubin: 1 mg/dL (ref 0.3–1.2)
Total Protein: 7.8 g/dL (ref 6.5–8.1)

## 2020-12-22 LAB — FERRITIN: Ferritin: 45 ng/mL (ref 11–307)

## 2020-12-22 LAB — IRON AND TIBC
Iron: 118 ug/dL (ref 28–170)
Saturation Ratios: 33 % — ABNORMAL HIGH (ref 10.4–31.8)
TIBC: 363 ug/dL (ref 250–450)
UIBC: 245 ug/dL

## 2020-12-22 NOTE — Progress Notes (Signed)
[place] CONSULT NOTE  Patient Care Team: Tracie Harrier, MD as PCP - General (Internal Medicine) Quintin Alto, MD as Consulting Physician (Rheumatology)  CHIEF COMPLAINTS/PURPOSE OF CONSULTATION: ANEMIA   HEMATOLOGY HISTORY:  # AUG 2022 ANEMIA-hemoglobin 10 ferritin 7 [PCP]; 2017- EGD/Colo-NEG [Dr.Elliot].  P.o. and constipation.  S/p IV iron infusion.  # ?  B12 deficiency B12 injections. [pcp]  #Mild intermittent leukopenia [since 2021]-August 2022-ANC 1.3 [PCP]-asymptomatic; AUG 2022-positive ANA awaiting GI evaluation  HISTORY OF PRESENTING ILLNESS: Alone.  Ambulating independently.  Caitlin Washington 55 y.o.  female iron deficient anemia of unclear etiology is here for follow-up.  Patient energy levels are adequate.  However complains of cramping in her legs joints.  Awaiting rheumatology evaluation next couple weeks.  Review of Systems  Constitutional:  Positive for malaise/fatigue. Negative for chills, diaphoresis, fever and weight loss.  HENT:  Negative for nosebleeds and sore throat.   Eyes:  Negative for double vision.  Respiratory:  Negative for cough, hemoptysis, sputum production, shortness of breath and wheezing.   Cardiovascular:  Negative for chest pain, palpitations, orthopnea and leg swelling.  Gastrointestinal:  Negative for abdominal pain, blood in stool, constipation, diarrhea, heartburn, melena, nausea and vomiting.  Genitourinary:  Negative for dysuria, frequency and urgency.  Musculoskeletal:  Positive for joint pain and myalgias.  Skin: Negative.  Negative for itching and rash.  Neurological:  Negative for dizziness, tingling, focal weakness, weakness and headaches.  Endo/Heme/Allergies:  Does not bruise/bleed easily.  Psychiatric/Behavioral:  Negative for depression. The patient is not nervous/anxious and does not have insomnia.    MEDICAL HISTORY:  Past Medical History:  Diagnosis Date  . Anemia   . Anxiety   . Dyspareunia  in female   . Hypertension   . Hypothyroidism   . PONV (postoperative nausea and vomiting)    after hysterectomy  . PVC (premature ventricular contraction)    controlled with metoprolol  . Sweating abnormality   . Thyroid disease   . Wears contact lenses     SURGICAL HISTORY: Past Surgical History:  Procedure Laterality Date  . ABDOMINAL HYSTERECTOMY    . COLONOSCOPY WITH PROPOFOL N/A 06/23/2015   Procedure: COLONOSCOPY WITH PROPOFOL;  Surgeon: Manya Silvas, MD;  Location: [facility] ENDOSCOPY;  Service: Endoscopy;  Laterality: N/A;  . FOOT SURGERY Right    buion excision  . HALLUX VALGUS AUSTIN Right 12/14/2015   Procedure: HALLUX VALGUS AUSTIN RIGHT FOOT;  Surgeon: [name], DPM;  Location: [facility];  Service: Podiatry;  Laterality: Right;  POPLITEAL  . TONSILLECTOMY    . TUBAL LIGATION      SOCIAL HISTORY: Social History   Socioeconomic History  . Marital status: Married    Spouse name: Not on file  . Number of children: Not on file  . Years of education: Not on file  . Highest education level: Not on file  Occupational History  . Not on file  Tobacco Use  . Smoking status: Never  . Smokeless tobacco: Never  Vaping Use  . Vaping Use: Never used  Substance and Sexual Activity  . Alcohol use: Yes    Alcohol/week: 0.0 standard drinks    Comment: 2 beers/month  . Drug use: No  . Sexual activity: Yes    Birth control/protection: Surgical  Other Topics Concern  . Not on file  Social History Narrative  . Not on file   Social Determinants of Health   Financial Resource Strain: Not on file  Food Insecurity:  Not on file  Transportation Needs: Not on file  Physical Activity: Not on file  Stress: Not on file  Social Connections: Not on file  Intimate Partner Violence: Not on file    FAMILY HISTORY: Family History  Problem Relation Age of Onset  . Hypertension Father   . Heart disease Maternal Grandmother   . Heart disease Paternal Grandfather    . Breast cancer Neg Hx     ALLERGIES:  is allergic to sulfa antibiotics, amoxicillin, and penicillins.  MEDICATIONS:  Current Outpatient Medications  Medication Sig Dispense Refill  . ALPRAZolam (XANAX) 0.25 MG tablet TAKE 1 TABLET BY MOUTH TWICE A DAY AS NEEDED FOR ANXIETY 60 tablet 2  . CALCIUM PO Take by mouth.    . citalopram (CELEXA) 10 MG tablet TAKE 1 TABLET BY MOUTH ONCE DAILY 90 tablet 3  . clotrimazole (LOTRIMIN) 1 % cream APPLY TWICE DAILY TO CORNERS OF MOUTH AS NEEDED 14 g 0  . cyanocobalamin (,VITAMIN B-12,) 1000 MCG/ML injection Inject 1 mL (1,000 mcg total) into the muscle once. 1 mL 0  . estradiol (VIVELLE-DOT) 0.05 MG/24HR patch PLACE 1 PATCH (0.05 MG TOTAL) ONTO THE SKIN 2 (TWO) TIMES A WEEK. 8 patch 11  . fluticasone (FLONASE) 50 MCG/ACT nasal spray USE TWO SPRAYS IN EACH NOSTRIL ONCE DAILY 16 g 2  . levothyroxine (SYNTHROID) 88 MCG tablet TAKE 1 TABLET BY MOUTH ONCE DAILY ON AN EMPTY STOMACH WITH A GLASS OF WATER AT LEAST 30-60 MINUTES BEFORE BREAKFAST. 90 tablet 3  . pantoprazole (PROTONIX) 20 MG tablet Take 20 mg by mouth daily.    Marland Kitchen levothyroxine (SYNTHROID, LEVOTHROID) 88 MCG tablet Take 88 mcg by mouth daily before breakfast.     No current facility-administered medications for this visit.      PHYSICAL EXAMINATION:   Vitals:   12/22/20 1111  BP: 110/71  Pulse: 79  Resp: 16  Temp: 98.6 F (37 C)   Filed Weights   12/22/20 1111  Weight: 168 lb (76.2 kg)    Physical Exam Vitals and nursing note reviewed.  HENT:     Head: Normocephalic and atraumatic.     Mouth/Throat:     Pharynx: Oropharynx is clear.  Eyes:     Extraocular Movements: Extraocular movements intact.     Pupils: Pupils are equal, round, and reactive to light.  Cardiovascular:     Rate and Rhythm: Normal rate and regular rhythm.  Pulmonary:     Comments: Decreased breath sounds bilaterally.  Abdominal:     Palpations: Abdomen is soft.  Musculoskeletal:        General:  Normal range of motion.     Cervical back: Normal range of motion.  Skin:    General: Skin is warm.  Neurological:     General: No focal deficit present.     Mental Status: She is alert and oriented to person, place, and time.  Psychiatric:        Behavior: Behavior normal.        Judgment: Judgment normal.    LABORATORY DATA:  I have reviewed the data as listed Lab Results  Component Value Date   WBC 3.2 (L) 12/22/2020   HGB 13.7 12/22/2020   HCT 41.9 12/22/2020   MCV 90.9 12/22/2020   PLT 166 12/22/2020   Recent Labs    11/04/20 1441 12/22/20 1054  NA 134* 137  K 3.8 4.4  CL 99 101  CO2 29 27  GLUCOSE 103* 115*  BUN 12 13  CREATININE 0.71 0.74  CALCIUM 8.9 8.9  GFRNONAA >60 >60  PROT 7.3 7.8  ALBUMIN 4.2 4.4  AST 21 22  ALT 20 19  ALKPHOS 60 51  BILITOT 0.4 1.0     No results found.  Symptomatic anemia # Anemia- AUG 2022-hemoglobin 9.8 iron deficiency- hemoglobin: ferritin: 7.  s/p- IV Venofer; DEC 2022- hemoglobin is 13.2- await iron studies.  Hold off on infusion today.   #Etiology: EGD/colo [remote]; awaiting repeat GI evaluation/EGD/Colo.   # Slightly low white count: Intermittent/ ANC 1.2.  Improved.  Possible etiologies include autoimmune- ANA positive; awaiting .? raynauds [mom-Ray]-  # DISPOSITION: will call re: ferritin/IV iron- mychart # HOLD venofer today # follow up  Dr.B   All questions were answered. The patient knows to call the clinic with any problems, questions or concerns.   Cammie Sickle, MD 12/22/2020 11:54 AM

## 2020-12-22 NOTE — Progress Notes (Signed)
Patient reports that she is having leg aches for the past 3 weeks which was an indication for need of iron infusion in the past.

## 2020-12-22 NOTE — Assessment & Plan Note (Signed)
#   Anemia- AUG 2022-hemoglobin 9.8 iron deficiency- hemoglobin: ferritin: 7.  s/p- IV Venofer; DEC 2022- hemoglobin is 13.2- await iron studies.  Hold off on infusion today.   #Etiology: EGD/colo [remote]; awaiting repeat GI evaluation/EGD/Colo.   # Slightly low white count: Intermittent/ ANC 1.2.  Improved.  Possible etiologies include autoimmune- ANA positive; awaiting .? raynauds [mom-Ray]-  # DISPOSITION: will call re: ferritin/IV iron- mychart # HOLD venofer today # follow up  Dr.B

## 2021-01-25 ENCOUNTER — Other Ambulatory Visit: Payer: Self-pay

## 2021-01-26 ENCOUNTER — Other Ambulatory Visit: Payer: Self-pay

## 2021-01-26 MED ORDER — CITALOPRAM HYDROBROMIDE 20 MG PO TABS
ORAL_TABLET | ORAL | 1 refills | Status: DC
  Filled 2021-01-26: qty 90, 90d supply, fill #0
  Filled 2021-04-18: qty 90, 90d supply, fill #1

## 2021-02-03 ENCOUNTER — Other Ambulatory Visit: Payer: Self-pay

## 2021-02-09 ENCOUNTER — Other Ambulatory Visit: Payer: Self-pay

## 2021-02-27 ENCOUNTER — Other Ambulatory Visit: Payer: Self-pay

## 2021-04-17 ENCOUNTER — Other Ambulatory Visit: Payer: Self-pay

## 2021-04-18 ENCOUNTER — Other Ambulatory Visit: Payer: Self-pay

## 2021-04-20 ENCOUNTER — Other Ambulatory Visit: Payer: Self-pay

## 2021-04-20 MED ORDER — PANTOPRAZOLE SODIUM 20 MG PO TBEC
DELAYED_RELEASE_TABLET | ORAL | 1 refills | Status: DC
  Filled 2021-04-20: qty 90, 90d supply, fill #0

## 2021-05-01 ENCOUNTER — Other Ambulatory Visit (HOSPITAL_COMMUNITY)
Admission: RE | Admit: 2021-05-01 | Discharge: 2021-05-01 | Disposition: A | Discharge status: Home / Self Care | Payer: BC Managed Care – PPO | Source: Ambulatory Visit | Attending: Certified Nurse Midwife | Admitting: Certified Nurse Midwife

## 2021-05-01 ENCOUNTER — Other Ambulatory Visit: Payer: Self-pay

## 2021-05-01 ENCOUNTER — Encounter: Payer: Self-pay | Admitting: Certified Nurse Midwife

## 2021-05-01 ENCOUNTER — Ambulatory Visit (INDEPENDENT_AMBULATORY_CARE_PROVIDER_SITE_OTHER): Payer: BC Managed Care – PPO | Admitting: Certified Nurse Midwife

## 2021-05-01 VITALS — BP 108/72 | HR 83 | Ht 67.0 in | Wt 164.1 lb

## 2021-05-01 DIAGNOSIS — B3731 Acute candidiasis of vulva and vagina: Secondary | ICD-10-CM | POA: Diagnosis not present

## 2021-05-01 DIAGNOSIS — L292 Pruritus vulvae: Secondary | ICD-10-CM

## 2021-05-01 DIAGNOSIS — N898 Other specified noninflammatory disorders of vagina: Secondary | ICD-10-CM

## 2021-05-01 LAB — POCT URINALYSIS DIPSTICK
Bilirubin, UA: NEGATIVE
Blood, UA: NEGATIVE
Glucose, UA: NEGATIVE
Ketones, UA: NEGATIVE
Leukocytes, UA: NEGATIVE
Nitrite, UA: NEGATIVE
Protein, UA: NEGATIVE
Spec Grav, UA: 1.02 (ref 1.010–1.025)
Urobilinogen, UA: 0.2 E.U./dL
pH, UA: 6.5 (ref 5.0–8.0)

## 2021-05-01 MED ORDER — FLUCONAZOLE 150 MG PO TABS
150.0000 mg | ORAL_TABLET | Freq: Every day | ORAL | 0 refills | Status: DC
  Filled 2021-05-01: qty 1, 1d supply, fill #0

## 2021-05-01 NOTE — Patient Instructions (Signed)

## 2021-05-01 NOTE — Progress Notes (Signed)
GYN ENCOUNTER NOTE ? ?Subjective:  ?    ? Caitlin Washington is a 56 y.o. [phone] female is here for gynecologic evaluation of the following issues:  ?1. Vulvar itching , states that she did change her soap recently from a generic Dove to [employer], she has also used a summers eve spray due to odor.   ? 2.Urinary odor for the past few days.  ? ?Gynecologic History ?No LMP recorded. Patient has had a hysterectomy. ?Contraception: post menopausal status/hysterectomy ?Last Pap: 2017. Results were: normal ?Last mammogram: 11/21/20. Results were: normal ? ?Obstetric History ?OB History  ?Gravida Para Term Preterm AB Living  ?'3 2 2   1 2  '$ ?SAB IAB Ectopic Multiple Live Births  ?1       2  ?  ?# Outcome Date GA Lbr Len/2nd Weight Sex Delivery Anes PTL Lv  ?3 Term 2005   6 lb 2.1 oz (2.781 kg) M Vag-Spont   LIV  ?2 Term 2001   5 lb 2.1 oz (2.327 kg) M Vag-Spont   LIV  ?1 SAB 2000          ? ? ?Past Medical History:  ?Diagnosis Date  ? Anemia   ? Anxiety   ? Dyspareunia in female   ? Hypertension   ? Hypothyroidism   ? PONV (postoperative nausea and vomiting)   ? after hysterectomy  ? PVC (premature ventricular contraction)   ? controlled with metoprolol  ? Sweating abnormality   ? Thyroid disease   ? Wears contact lenses   ? ? ?Past Surgical History:  ?Procedure Laterality Date  ? ABDOMINAL HYSTERECTOMY    ? COLONOSCOPY WITH PROPOFOL N/A 06/23/2015  ? Procedure: COLONOSCOPY WITH PROPOFOL;  Surgeon: Manya Silvas, MD;  Location: [facility] ENDOSCOPY;  Service: Endoscopy;  Laterality: N/A;  ? FOOT SURGERY Right   ? buion excision  ? HALLUX VALGUS AUSTIN Right 12/14/2015  ? Procedure: HALLUX VALGUS AUSTIN RIGHT FOOT;  Surgeon: [name], DPM;  Location: [place];  Service: Podiatry;  Laterality: Right;  POPLITEAL  ? TONSILLECTOMY    ? TUBAL LIGATION    ? ? ?Current Outpatient Medications on File Prior to Visit  ?Medication Sig Dispense Refill  ? ALPRAZolam (XANAX) 0.25 MG tablet TAKE 1 TABLET BY MOUTH TWICE A DAY  AS NEEDED FOR ANXIETY 60 tablet 2  ? CALCIUM PO Take by mouth.    ? citalopram (CELEXA) 20 MG tablet Take 1 tablet (20 mg total) by mouth once daily 90 tablet 1  ? cyanocobalamin (,VITAMIN B-12,) 1000 MCG/ML injection Inject 1 mL (1,000 mcg total) into the muscle once. 1 mL 0  ? estradiol (VIVELLE-DOT) 0.05 MG/24HR patch PLACE 1 PATCH (0.05 MG TOTAL) ONTO THE SKIN 2 (TWO) TIMES A WEEK. 8 patch 11  ? fluticasone (FLONASE) 50 MCG/ACT nasal spray USE TWO SPRAYS IN EACH NOSTRIL ONCE DAILY 16 g 2  ? levothyroxine (SYNTHROID) 88 MCG tablet TAKE 1 TABLET BY MOUTH ONCE DAILY ON AN EMPTY STOMACH WITH A GLASS OF WATER AT LEAST 30-60 MINUTES BEFORE BREAKFAST. 90 tablet 3  ? pantoprazole (PROTONIX) 20 MG tablet Take 20 mg by mouth daily.    ? citalopram (CELEXA) 10 MG tablet TAKE 1 TABLET BY MOUTH ONCE DAILY (Patient not taking: Reported on 05/01/2021) 90 tablet 3  ? levothyroxine (SYNTHROID, LEVOTHROID) 88 MCG tablet Take 88 mcg by mouth daily before breakfast. (Patient not taking: Reported on 05/01/2021)    ? pantoprazole (PROTONIX) 20 MG tablet Take 1  tablet (20 mg total) by mouth once daily (Patient not taking: Reported on 05/01/2021) 90 tablet 1  ? ?No current facility-administered medications on file prior to visit.  ? ? ?Allergies  ?Allergen Reactions  ? Sulfa Antibiotics Hives  ? Amoxicillin Rash  ? Penicillins Rash  ? ? ?Social History  ? ?Socioeconomic History  ? Marital status: Married  ?  Spouse name: Not on file  ? Number of children: Not on file  ? Years of education: Not on file  ? Highest education level: Not on file  ?Occupational History  ? Not on file  ?Tobacco Use  ? Smoking status: Never  ? Smokeless tobacco: Never  ?Vaping Use  ? Vaping Use: Never used  ?Substance and Sexual Activity  ? Alcohol use: Yes  ?  Alcohol/week: 0.0 standard drinks  ?  Comment: 2 beers/month  ? Drug use: No  ? Sexual activity: Yes  ?  Birth control/protection: Surgical  ?Other Topics Concern  ? Not on file  ?Social History Narrative   ? Not on file  ? ?Social Determinants of Health  ? ?Financial Resource Strain: Not on file  ?Food Insecurity: Not on file  ?Transportation Needs: Not on file  ?Physical Activity: Not on file  ?Stress: Not on file  ?Social Connections: Not on file  ?Intimate Partner Violence: Not on file  ? ? ?Family History  ?Problem Relation Age of Onset  ? Hypertension Father   ? Heart disease Maternal Grandmother   ? Heart disease Paternal Grandfather   ? Breast cancer Neg Hx   ? ? ?The following portions of the patient's history were reviewed and updated as appropriate: allergies, current medications, past family history, past medical history, past social history, past surgical history and problem list. ? ?Review of Systems ?Review of Systems - Negative except as mentioned hpi ?Review of Systems - General ROS: negative for - chills, fatigue, fever, hot flashes, malaise or night sweats ?Hematological and Lymphatic ROS: negative for - bleeding problems or swollen lymph nodes ?Gastrointestinal ROS: negative for - abdominal pain, blood in stools, change in bowel habits and nausea/vomiting ?Musculoskeletal ROS: negative for - joint pain, muscle pain or muscular weakness ?Genito-Urinary ROS: negative for - change in menstrual cycle, dysmenorrhea, dyspareunia, dysuria, genital discharge, genital ulcers, hematuria, incontinence, irregular/heavy menses, nocturia or pelvic pain ? ?Objective:  ? ?BP 108/72   Pulse 83   Ht '5\' 7"'$  (1.702 m)   Wt 164 lb 1.6 oz (74.4 kg)   BMI 25.70 kg/m?  ?CONSTITUTIONAL: Well-developed, well-nourished female in no acute distress.  ?HENT:  Normocephalic, atraumatic.  ?NECK: Normal range of motion, supple, no masses.  Normal thyroid.  ?SKIN: Skin is warm and dry. No rash noted. Not diaphoretic. No erythema. No pallor. ?[place]: Alert and oriented to person, place, and time. PSYCHIATRIC: Normal mood and affect. Normal behavior. Normal judgment and thought content. ?CARDIOVASCULAR:Not  Examined ?RESPIRATORY: Not Examined ?BREASTS: Not Examined ?ABDOMEN: Soft, non distended; Non tender.  No Organomegaly. ?PELVIC: ? External Genitalia: Normal, no redness noted ? BUS: Normal ? Vagina: Normal, white discharge,normal atrophic changes ? Cervix: absent,  ?MUSCULOSKELETAL: Normal range of motion. No tenderness.  No cyanosis, clubbing, or edema. ? ? ? ? ?Assessment:  ? ?Vulvar itching ?Urinary odor  ? ? ?Plan:  ? ?Vaginal swab collected. Discussed use of replens and boric acid for symptom management. Diflucan ordered. Also reviewed use of topical steroid cream if swab negative for infection. She verbalizes and agrees to plan. Recommend epsom salt bath ,  cool compress with itching. She verbalizes and agrees to plan of care.  ? ?Philip Aspen, CNM  ?

## 2021-05-03 ENCOUNTER — Other Ambulatory Visit: Payer: Self-pay | Admitting: Certified Nurse Midwife

## 2021-05-03 ENCOUNTER — Encounter: Payer: Self-pay | Admitting: Certified Nurse Midwife

## 2021-05-03 LAB — CERVICOVAGINAL ANCILLARY ONLY
Bacterial Vaginitis (gardnerella): NEGATIVE
Candida Glabrata: NEGATIVE
Candida Vaginitis: POSITIVE — AB
Comment: NEGATIVE
Comment: NEGATIVE
Comment: NEGATIVE

## 2021-05-25 ENCOUNTER — Other Ambulatory Visit: Payer: Self-pay

## 2021-05-29 ENCOUNTER — Other Ambulatory Visit: Payer: Self-pay

## 2021-05-30 ENCOUNTER — Other Ambulatory Visit: Payer: Self-pay

## 2021-05-31 ENCOUNTER — Other Ambulatory Visit: Payer: Self-pay

## 2021-05-31 MED ORDER — LEVOTHYROXINE SODIUM 88 MCG PO TABS
ORAL_TABLET | ORAL | 1 refills | Status: DC
  Filled 2021-06-01: qty 90, 90d supply, fill #0

## 2021-06-01 ENCOUNTER — Other Ambulatory Visit: Payer: Self-pay

## 2021-06-07 ENCOUNTER — Other Ambulatory Visit: Payer: Self-pay

## 2021-06-13 ENCOUNTER — Other Ambulatory Visit: Payer: Self-pay

## 2021-06-13 ENCOUNTER — Encounter: Payer: Self-pay | Admitting: Internal Medicine

## 2021-06-15 ENCOUNTER — Encounter: Payer: Self-pay | Admitting: Internal Medicine

## 2021-06-26 ENCOUNTER — Inpatient Hospital Stay (HOSPITAL_BASED_OUTPATIENT_CLINIC_OR_DEPARTMENT_OTHER): Payer: BC Managed Care – PPO | Admitting: Internal Medicine

## 2021-06-26 ENCOUNTER — Inpatient Hospital Stay: Payer: BC Managed Care – PPO

## 2021-06-26 ENCOUNTER — Encounter: Payer: Self-pay | Admitting: Internal Medicine

## 2021-06-26 ENCOUNTER — Inpatient Hospital Stay: Payer: BC Managed Care – PPO | Attending: Internal Medicine

## 2021-06-26 DIAGNOSIS — D649 Anemia, unspecified: Secondary | ICD-10-CM

## 2021-06-26 DIAGNOSIS — E538 Deficiency of other specified B group vitamins: Secondary | ICD-10-CM | POA: Insufficient documentation

## 2021-06-26 DIAGNOSIS — M3500 Sicca syndrome, unspecified: Secondary | ICD-10-CM | POA: Insufficient documentation

## 2021-06-26 DIAGNOSIS — D509 Iron deficiency anemia, unspecified: Secondary | ICD-10-CM | POA: Insufficient documentation

## 2021-06-26 LAB — FERRITIN: Ferritin: 39 ng/mL (ref 11–307)

## 2021-06-26 LAB — CBC WITH DIFFERENTIAL/PLATELET
Abs Immature Granulocytes: 0.01 10*3/uL (ref 0.00–0.07)
Basophils Absolute: 0 10*3/uL (ref 0.0–0.1)
Basophils Relative: 1 %
Eosinophils Absolute: 0 10*3/uL (ref 0.0–0.5)
Eosinophils Relative: 1 %
HCT: 41.2 % (ref 36.0–46.0)
Hemoglobin: 13.3 g/dL (ref 12.0–15.0)
Immature Granulocytes: 0 %
Lymphocytes Relative: 31 %
Lymphs Abs: 1.4 10*3/uL (ref 0.7–4.0)
MCH: 29.8 pg (ref 26.0–34.0)
MCHC: 32.3 g/dL (ref 30.0–36.0)
MCV: 92.4 fL (ref 80.0–100.0)
Monocytes Absolute: 0.3 10*3/uL (ref 0.1–1.0)
Monocytes Relative: 6 %
Neutro Abs: 2.8 10*3/uL (ref 1.7–7.7)
Neutrophils Relative %: 61 %
Platelets: 170 10*3/uL (ref 150–400)
RBC: 4.46 MIL/uL (ref 3.87–5.11)
RDW: 12.7 % (ref 11.5–15.5)
WBC: 4.6 10*3/uL (ref 4.0–10.5)
nRBC: 0 % (ref 0.0–0.2)

## 2021-06-26 LAB — BASIC METABOLIC PANEL
Anion gap: 8 (ref 5–15)
BUN: 13 mg/dL (ref 6–20)
CO2: 26 mmol/L (ref 22–32)
Calcium: 8.7 mg/dL — ABNORMAL LOW (ref 8.9–10.3)
Chloride: 99 mmol/L (ref 98–111)
Creatinine, Ser: 0.66 mg/dL (ref 0.44–1.00)
GFR, Estimated: >60 mL/min (ref >60)
Glucose, Bld: 109 mg/dL — ABNORMAL HIGH (ref 70–99)
Potassium: 4 mmol/L (ref 3.5–5.1)
Sodium: 133 mmol/L — ABNORMAL LOW (ref 135–145)

## 2021-06-26 LAB — IRON AND TIBC
Iron: 108 ug/dL (ref 28–170)
Saturation Ratios: 31 % (ref 10.4–31.8)
TIBC: 351 ug/dL (ref 250–450)
UIBC: 243 ug/dL

## 2021-06-26 NOTE — Progress Notes (Signed)
[name] NOTE  Patient Care Team: Tracie Harrier, MD as PCP - General (Internal Medicine) Quintin Alto, MD as Consulting Physician (Rheumatology) Cammie Sickle, MD as Consulting Physician (Oncology)  CHIEF COMPLAINTS/PURPOSE OF CONSULTATION: ANEMIA   HEMATOLOGY HISTORY:  # AUG 2022 ANEMIA-hemoglobin 10 ferritin 7 [PCP]; 2017- EGD/Colo-NEG [Dr.Elliot].  P.o. and constipation.  S/p IV iron infusion.  # ?  B12 deficiency B12 injections. [pcp]  #Mild intermittent leukopenia [since 2021]-August 2022-ANC 1.3 [PCP]-asymptomatic; AUG 2022-positive ANA awaiting Rheumatology eval/Dr.Patel-? Sjogrens  HISTORY OF PRESENTING ILLNESS: Alone.  Ambulating independently.  Caitlin Washington 56 y.o.  female iron deficient anemia of unclear etiology is here for follow-up.   S/p  rheumatology evaluation-joint pains etc.  Patient denies any worsening fatigue.  Denies any blood in stools or black or stools.   Patient energy levels are adequate.    Review of Systems  Constitutional:  Positive for malaise/fatigue. Negative for chills, diaphoresis, fever and weight loss.  HENT:  Negative for nosebleeds and sore throat.   Eyes:  Negative for double vision.  Respiratory:  Negative for cough, hemoptysis, sputum production, shortness of breath and wheezing.   Cardiovascular:  Negative for chest pain, palpitations, orthopnea and leg swelling.  Gastrointestinal:  Negative for abdominal pain, blood in stool, constipation, diarrhea, heartburn, melena, nausea and vomiting.  Genitourinary:  Positive for hematuria. Negative for dysuria, frequency and urgency.  Musculoskeletal:  Positive for joint pain and myalgias.  Skin: Negative.  Negative for itching and rash.  Neurological:  Negative for dizziness, tingling, focal weakness, weakness and headaches.  Endo/Heme/Allergies:  Does not bruise/bleed easily.  Psychiatric/Behavioral:  Negative for depression. The patient is not  nervous/anxious and does not have insomnia.    MEDICAL HISTORY:  Past Medical History:  Diagnosis Date   Anemia    Anxiety    Dyspareunia in female    Hypertension    Hypothyroidism    PONV (postoperative nausea and vomiting)    after hysterectomy   PVC (premature ventricular contraction)    controlled with metoprolol   Sweating abnormality    Thyroid disease    Wears contact lenses     SURGICAL HISTORY: Past Surgical History:  Procedure Laterality Date   ABDOMINAL HYSTERECTOMY     COLONOSCOPY WITH PROPOFOL N/A 06/23/2015   Procedure: COLONOSCOPY WITH PROPOFOL;  Surgeon: Manya Silvas, MD;  Location: [facility];  Service: Endoscopy;  Laterality: N/A;   FOOT SURGERY Right    buion excision   HALLUX VALGUS AUSTIN Right 12/14/2015   Procedure: HALLUX VALGUS AUSTIN RIGHT FOOT;  Surgeon: [name], DPM;  Location: [facility];  Service: Podiatry;  Laterality: Right;  POPLITEAL   TONSILLECTOMY     TUBAL LIGATION      SOCIAL HISTORY: Social History   Socioeconomic History   Marital status: Married    Spouse name: Not on file   Number of children: Not on file   Years of education: Not on file   Highest education level: Not on file  Occupational History   Not on file  Tobacco Use   Smoking status: Never   Smokeless tobacco: Never  Vaping Use   Vaping Use: Never used  Substance and Sexual Activity   Alcohol use: Yes    Alcohol/week: 0.0 standard drinks    Comment: 2 beers/month   Drug use: No   Sexual activity: Yes    Birth control/protection: Surgical  Other Topics Concern   Not on file  Social History  Narrative   Not on file   Social Determinants of Health   Financial Resource Strain: Not on file  Food Insecurity: Not on file  Transportation Needs: Not on file  Physical Activity: Not on file  Stress: Not on file  Social Connections: Not on file  Intimate Partner Violence: Not on file    FAMILY HISTORY: Family History  Problem Relation  Age of Onset   Hypertension Father    Heart disease Maternal Grandmother    Heart disease Paternal Grandfather    Breast cancer Neg Hx     ALLERGIES:  is allergic to sulfa antibiotics, amoxicillin, and penicillins.  MEDICATIONS:  Current Outpatient Medications  Medication Sig Dispense Refill   CALCIUM PO Take by mouth.     citalopram (CELEXA) 20 MG tablet Take 1 tablet (20 mg total) by mouth once daily 90 tablet 1   cyanocobalamin (,VITAMIN B-12,) 1000 MCG/ML injection Inject 1 mL (1,000 mcg total) into the muscle once. 1 mL 0   estradiol (VIVELLE-DOT) 0.05 MG/24HR patch PLACE 1 PATCH (0.05 MG TOTAL) ONTO THE SKIN 2 (TWO) TIMES A WEEK. 8 patch 11   ALPRAZolam (XANAX) 0.25 MG tablet TAKE 1 TABLET BY MOUTH TWICE A DAY AS NEEDED FOR ANXIETY (Patient not taking: Reported on 06/26/2021) 60 tablet 2   citalopram (CELEXA) 10 MG tablet TAKE 1 TABLET BY MOUTH ONCE DAILY (Patient not taking: Reported on 05/01/2021) 90 tablet 3   fluticasone (FLONASE) 50 MCG/ACT nasal spray USE TWO SPRAYS IN EACH NOSTRIL ONCE DAILY (Patient not taking: Reported on 06/26/2021) 16 g 2   levothyroxine (SYNTHROID) 88 MCG tablet TAKE 1 TABLET BY MOUTH ONCE DAILY ON AN EMPTY STOMACH WITH A GLASS OF WATER AT LEAST 30-60 MINUTES BEFORE BREAKFAST. 90 tablet 3   levothyroxine (SYNTHROID, LEVOTHROID) 88 MCG tablet Take 88 mcg by mouth daily before breakfast. (Patient not taking: Reported on 05/01/2021)     pantoprazole (PROTONIX) 20 MG tablet Take 20 mg by mouth daily. (Patient not taking: Reported on 06/26/2021)     pantoprazole (PROTONIX) 20 MG tablet Take 1 tablet (20 mg total) by mouth once daily (Patient not taking: Reported on 05/01/2021) 90 tablet 1   No current facility-administered medications for this visit.      PHYSICAL EXAMINATION:   Vitals:   06/26/21 1313  BP: (!) 128/53  Pulse: 77  Resp: 16  Temp: 98.8 F (37.1 C)  SpO2: 99%   Filed Weights   06/26/21 1313  Weight: 165 lb (74.8 kg)    Physical  Exam Vitals and nursing note reviewed.  HENT:     Head: Normocephalic and atraumatic.     Mouth/Throat:     Pharynx: Oropharynx is clear.  Eyes:     Extraocular Movements: Extraocular movements intact.     Pupils: Pupils are equal, round, and reactive to light.  Cardiovascular:     Rate and Rhythm: Normal rate and regular rhythm.  Pulmonary:     Comments: Decreased breath sounds bilaterally.  Abdominal:     Palpations: Abdomen is soft.  Musculoskeletal:        General: Normal range of motion.     Cervical back: Normal range of motion.  Skin:    General: Skin is warm.  Neurological:     General: No focal deficit present.     Mental Status: She is alert and oriented to person, place, and time.  Psychiatric:        Behavior: Behavior normal.  Judgment: Judgment normal.    LABORATORY DATA:  I have reviewed the data as listed Lab Results  Component Value Date   WBC 4.6 06/26/2021   HGB 13.3 06/26/2021   HCT 41.2 06/26/2021   MCV 92.4 06/26/2021   PLT 170 06/26/2021   Recent Labs    11/04/20 1441 12/22/20 1054 06/26/21 1247  NA 134* 137 133*  K 3.8 4.4 4.0  CL 99 101 99  CO2 '29 27 26  '$ GLUCOSE 103* 115* 109*  BUN '12 13 13  '$ CREATININE 0.71 0.74 0.66  CALCIUM 8.9 8.9 8.7*  GFRNONAA >60 >60 >60  PROT 7.3 7.8  --   ALBUMIN 4.2 4.4  --   AST 21 22  --   ALT 20 19  --   ALKPHOS 60 51  --   BILITOT 0.4 1.0  --      No results found.  Symptomatic anemia # Anemia- AUG 2022-hemoglobin 9.8 iron deficiency- hemoglobin: ferritin: 7.  s/p- IV Venofer; DEC 2022- hemoglobin is 13.2- await iron studies.  Hold off on infusion today.   #Etiology: EGD/colo [remote]; awaiting repeat GI evaluation/EGD/Colo.   # ? Sjogrens-s/p Evaluation with rheumatology.  # DISPOSITION:  # HOLD venofer today # follow up in 6 months- MD; labs- cbc/cbmp; iron studies;ferritin; possible venofer-   Dr.B   All questions were answered. The patient knows to call the clinic with any  problems, questions or concerns.   Cammie Sickle, MD 06/26/2021 10:00 PM

## 2021-06-26 NOTE — Progress Notes (Signed)
Pt states at times she feels something on her neck that feels like a lymph node.

## 2021-06-26 NOTE — Assessment & Plan Note (Addendum)
#   Anemia- AUG 2022-hemoglobin 9.8 iron deficiency- hemoglobin: ferritin: 7.  s/p- IV Venofer; DEC 2022- hemoglobin is 13.2- await iron studies.  Hold off on infusion today.   #Etiology: EGD/colo [remote]; awaiting repeat GI evaluation/EGD/Colo.   # ? Sjogrens-s/p Evaluation with rheumatology.  # DISPOSITION:  # HOLD venofer today # follow up in 6 months- MD; labs- cbc/cbmp; iron studies;ferritin; possible venofer-   Dr.B

## 2021-07-07 ENCOUNTER — Other Ambulatory Visit: Payer: Self-pay

## 2021-07-16 ENCOUNTER — Ambulatory Visit
Admission: EM | Admit: 2021-07-16 | Discharge: 2021-07-16 | Disposition: A | Discharge status: Home / Self Care | Payer: BC Managed Care – PPO | Attending: Urgent Care | Admitting: Urgent Care

## 2021-07-16 ENCOUNTER — Encounter: Payer: Self-pay | Admitting: Emergency Medicine

## 2021-07-16 DIAGNOSIS — K122 Cellulitis and abscess of mouth: Secondary | ICD-10-CM | POA: Insufficient documentation

## 2021-07-16 DIAGNOSIS — B370 Candidal stomatitis: Secondary | ICD-10-CM | POA: Diagnosis present

## 2021-07-16 LAB — POCT RAPID STREP A (OFFICE): Rapid Strep A Screen: NEGATIVE

## 2021-07-16 MED ORDER — NYSTATIN 100000 UNIT/ML MT SUSP: 500000.0000 [IU] | Freq: Four times a day (QID) | OROMUCOSAL | 0 refills | Status: DC

## 2021-07-16 NOTE — ED Triage Notes (Signed)
Pt presents with a ST x 2 days. This morning she noticed a white coating on her tongue.

## 2021-07-19 LAB — CULTURE, GROUP A STREP (THRC)

## 2021-08-08 ENCOUNTER — Other Ambulatory Visit: Payer: Self-pay

## 2021-08-08 MED ORDER — PANTOPRAZOLE SODIUM 20 MG PO TBEC
DELAYED_RELEASE_TABLET | ORAL | 1 refills | Status: DC
  Filled 2021-08-08: qty 90, 90d supply, fill #0
  Filled 2021-11-12: qty 90, 90d supply, fill #1

## 2021-08-09 ENCOUNTER — Other Ambulatory Visit: Payer: Self-pay

## 2021-08-20 ENCOUNTER — Other Ambulatory Visit: Payer: Self-pay

## 2021-08-21 ENCOUNTER — Other Ambulatory Visit: Payer: Self-pay

## 2021-08-21 MED ORDER — CITALOPRAM HYDROBROMIDE 20 MG PO TABS
ORAL_TABLET | ORAL | 1 refills | Status: DC
  Filled 2021-08-21: qty 90, 90d supply, fill #0
  Filled 2021-12-07: qty 90, 90d supply, fill #1

## 2021-08-24 ENCOUNTER — Other Ambulatory Visit: Payer: Self-pay

## 2021-08-30 ENCOUNTER — Other Ambulatory Visit: Payer: Self-pay

## 2021-08-30 MED ORDER — LEVOTHYROXINE SODIUM 88 MCG PO TABS
ORAL_TABLET | ORAL | 1 refills | Status: DC
  Filled 2021-08-30: qty 90, 90d supply, fill #0

## 2021-08-30 MED ORDER — LEVOTHYROXINE SODIUM 88 MCG PO TABS
88.0000 ug | ORAL_TABLET | Freq: Every day | ORAL | 3 refills | Status: DC
  Filled 2021-08-30: qty 90, 90d supply, fill #0
  Filled 2021-12-07: qty 90, 90d supply, fill #1
  Filled 2022-03-02: qty 90, 90d supply, fill #2
  Filled 2022-06-12: qty 90, 90d supply, fill #3

## 2021-09-04 ENCOUNTER — Other Ambulatory Visit: Payer: Self-pay

## 2021-09-04 MED ORDER — NA SULFATE-K SULFATE-MG SULF 17.5-3.13-1.6 GM/177ML PO SOLN
ORAL | 0 refills | Status: DC
  Filled 2021-09-04: qty 354, 1d supply, fill #0

## 2021-09-08 ENCOUNTER — Other Ambulatory Visit (HOSPITAL_COMMUNITY): Payer: Self-pay

## 2021-09-11 ENCOUNTER — Ambulatory Visit (INDEPENDENT_AMBULATORY_CARE_PROVIDER_SITE_OTHER): Payer: BC Managed Care – PPO | Admitting: Certified Nurse Midwife

## 2021-09-11 ENCOUNTER — Other Ambulatory Visit: Payer: Self-pay

## 2021-09-11 ENCOUNTER — Encounter: Payer: Self-pay | Admitting: Certified Nurse Midwife

## 2021-09-11 VITALS — BP 119/77 | HR 69 | Ht 67.0 in | Wt 165.9 lb

## 2021-09-11 DIAGNOSIS — Z01419 Encounter for gynecological examination (general) (routine) without abnormal findings: Secondary | ICD-10-CM

## 2021-09-11 DIAGNOSIS — Z1231 Encounter for screening mammogram for malignant neoplasm of breast: Secondary | ICD-10-CM | POA: Diagnosis not present

## 2021-09-11 MED ORDER — ESTRADIOL 0.05 MG/24HR TD PTTW
MEDICATED_PATCH | TRANSDERMAL | 11 refills | Status: DC
  Filled 2021-09-11: qty 8, 28d supply, fill #0

## 2021-09-11 NOTE — Progress Notes (Signed)
GYNECOLOGY ANNUAL PREVENTATIVE CARE ENCOUNTER NOTE  History:     Caitlin Washington is a 56 y.o. G70P2012 female here for a routine annual gynecologic exam.  Current complaints: none.   Denies abnormal vaginal bleeding, discharge, pelvic pain, problems with intercourse or other gynecologic concerns.     Social Relationship: married  Living: spouse and son ( has 4 children) Work: retirement community  Exercise: walking  Smoke/Alcohol/drug use: denies use   Gynecologic History No LMP recorded. Patient has had a hysterectomy. Contraception: status post hysterectomy Last Pap: 03/2015.hysterectomy  Last mammogram: 11/21/2020. Results were: normal  Obstetric History OB History  Gravida Para Term Preterm AB Living  '3 2 2   1 2  ' SAB IAB Ectopic Multiple Live Births  1       2    # Outcome Date GA Lbr Len/2nd Weight Sex Delivery Anes PTL Lv  3 Term 2005   6 lb 2.1 oz (2.781 kg) M Vag-Spont   LIV  2 Term 2001   5 lb 2.1 oz (2.327 kg) M Vag-Spont   LIV  1 SAB 2000            Past Medical History:  Diagnosis Date   Anemia    Anxiety    Dyspareunia in female    Hypertension    Hypothyroidism    PONV (postoperative nausea and vomiting)    after hysterectomy   PVC (premature ventricular contraction)    controlled with metoprolol   Sweating abnormality    Thyroid disease    Wears contact lenses   . Sjgrens     Past Surgical History:  Procedure Laterality Date   ABDOMINAL HYSTERECTOMY     COLONOSCOPY WITH PROPOFOL N/A 06/23/2015   Procedure: COLONOSCOPY WITH PROPOFOL;  Surgeon: Manya Silvas, MD;  Location: [facility] ENDOSCOPY;  Service: Endoscopy;  Laterality: N/A;   FOOT SURGERY Right    buion excision   HALLUX VALGUS AUSTIN Right 12/14/2015   Procedure: HALLUX VALGUS AUSTIN RIGHT FOOT;  Surgeon: [name], DPM;  Location: [place];  Service: Podiatry;  Laterality: Right;  POPLITEAL   TONSILLECTOMY     TUBAL LIGATION      Current Outpatient Medications  on File Prior to Visit  Medication Sig Dispense Refill   CALCIUM PO Take by mouth.     citalopram (CELEXA) 20 MG tablet Take 1 tablet (20 mg total) by mouth once daily 90 tablet 1   cyanocobalamin (,VITAMIN B-12,) 1000 MCG/ML injection Inject 1 mL (1,000 mcg total) into the muscle once. 1 mL 0   estradiol (VIVELLE-DOT) 0.05 MG/24HR patch PLACE 1 PATCH (0.05 MG TOTAL) ONTO THE SKIN 2 (TWO) TIMES A WEEK. 8 patch 11   levothyroxine (SYNTHROID) 88 MCG tablet TAKE 1 TABLET BY MOUTH ONCE DAILY ON AN EMPTY STOMACH WITH A GLASS OF WATER AT LEAST 30-60 MINUTES BEFORE BREAKFAST. 90 tablet 3   levothyroxine (SYNTHROID) 88 MCG tablet Take 1 tablet (88 mcg total) by mouth every morning before breakfast (0630) for 180 days ON AN EMPTY STOMACH WITH A GLASS OF WATER AT LEAST 30-60 MINUTES BEFORE BREAKFAST 90 tablet 1   Na Sulfate-K Sulfate-Mg Sulf 17.5-3.13-1.6 GM/177ML SOLN Take 2 Bottles (1 kit total) by mouth as directed One kit contains 2 bottles.  Take both bottles at the times instructed by your provider. 354 mL 0   nystatin (MYCOSTATIN) 100000 UNIT/ML suspension Take 5 mLs (500,000 Units total) by mouth 4 (four) times daily. Swish for 5 minutes prior  to spitting out 60 mL 0   pantoprazole (PROTONIX) 20 MG tablet Take 1 tablet (20 mg total) by mouth once daily for 180 days 90 tablet 1   No current facility-administered medications on file prior to visit.    Allergies  Allergen Reactions   Sulfa Antibiotics Hives   Amoxicillin Rash   Penicillins Rash    Social History:  reports that she has never smoked. She has never used smokeless tobacco. She reports current alcohol use. She reports that she does not use drugs.  Family History  Problem Relation Age of Onset   Hypertension Father    Heart disease Maternal Grandmother    Heart disease Paternal Grandfather    Breast cancer Neg Hx     The following portions of the patient's history were reviewed and updated as appropriate: allergies, current  medications, past family history, past medical history, past social history, past surgical history and problem list.  Review of Systems Pertinent items noted in HPI and remainder of comprehensive ROS otherwise negative.  Physical Exam:  BP 119/77   Pulse 69   Ht '5\' 7"'  (1.702 m)   Wt 165 lb 14.4 oz (75.3 kg)   BMI 25.98 kg/m  CONSTITUTIONAL: Well-developed, well-nourished female in no acute distress.  HENT:  Normocephalic, atraumatic, External right and left ear normal. Oropharynx is clear and moist EYES: Conjunctivae and EOM are normal. Pupils are equal, round, and reactive to light. No scleral icterus.  NECK: Normal range of motion, supple, no masses.  Normal thyroid.  SKIN: Skin is warm and dry. No rash noted. Not diaphoretic. No erythema. No pallor. MUSCULOSKELETAL: Normal range of motion. No tenderness.  No cyanosis, clubbing, or edema.  2+ distal pulses. NEUROLOGIC: Alert and oriented to person, place, and time. Normal reflexes, muscle tone coordination.  PSYCHIATRIC: Normal mood and affect. Normal behavior. Normal judgment and thought content. CARDIOVASCULAR: Normal heart rate noted, regular rhythm RESPIRATORY: Clear to auscultation bilaterally. Effort and breath sounds normal, no problems with respiration noted. BREASTS: Symmetric in size. No masses, tenderness, skin changes, nipple drainage, or lymphadenopathy bilaterally.  ABDOMEN: Soft, no distention noted.  No tenderness, rebound or guarding.  PELVIC: Normal appearing external genitalia and urethral meatus; normal appearing vaginal mucosa and cervix.  No abnormal discharge noted.  Pap smear obtained.  Normal uterine size, no other palpable masses, no uterine or adnexal tenderness.  .   Assessment and Plan:    1. Well woman exam with routine gynecological exam  Pap: n/a hysterectomy  Mammogram : ordered Labs: none due  Refills:estrogen patches ( pt considering stopping this winter) reviewed risk vs benefits of use   Referral: none  Routine preventative health maintenance measures emphasized. Please refer to After Visit Summary for other counseling recommendations.      Philip Aspen, CNM [facility]

## 2021-09-11 NOTE — Patient Instructions (Signed)
Preventive Care 40-57 Years Old, Female Preventive care refers to lifestyle choices and visits with your health care provider that can promote health and wellness. Preventive care visits are also called wellness exams. What can I expect for my preventive care visit? Counseling Your health care provider may ask you questions about your: Medical history, including: Past medical problems. Family medical history. Pregnancy history. Current health, including: Menstrual cycle. Method of birth control. Emotional well-being. Home life and relationship well-being. Sexual activity and sexual health. Lifestyle, including: Alcohol, nicotine or tobacco, and drug use. Access to firearms. Diet, exercise, and sleep habits. Work and work environment. Sunscreen use. Safety issues such as seatbelt and bike helmet use. Physical exam Your health care provider will check your: Height and weight. These may be used to calculate your BMI (body mass index). BMI is a measurement that tells if you are at a healthy weight. Waist circumference. This measures the distance around your waistline. This measurement also tells if you are at a healthy weight and may help predict your risk of certain diseases, such as type 2 diabetes and high blood pressure. Heart rate and blood pressure. Body temperature. Skin for abnormal spots. What immunizations do I need?  Vaccines are usually given at various ages, according to a schedule. Your health care provider will recommend vaccines for you based on your age, medical history, and lifestyle or other factors, such as travel or where you work. What tests do I need? Screening Your health care provider may recommend screening tests for certain conditions. This may include: Lipid and cholesterol levels. Diabetes screening. This is done by checking your blood sugar (glucose) after you have not eaten for a while (fasting). Pelvic exam and Pap test. Hepatitis B test. Hepatitis C  test. HIV (human immunodeficiency virus) test. STI (sexually transmitted infection) testing, if you are at risk. Lung cancer screening. Colorectal cancer screening. Mammogram. Talk with your health care provider about when you should start having regular mammograms. This may depend on whether you have a family history of breast cancer. BRCA-related cancer screening. This may be done if you have a family history of breast, ovarian, tubal, or peritoneal cancers. Bone density scan. This is done to screen for osteoporosis. Talk with your health care provider about your test results, treatment options, and if necessary, the need for more tests. Follow these instructions at home: Eating and drinking  Eat a diet that includes fresh fruits and vegetables, whole grains, lean protein, and low-fat dairy products. Take vitamin and mineral supplements as recommended by your health care provider. Do not drink alcohol if: Your health care provider tells you not to drink. You are pregnant, may be pregnant, or are planning to become pregnant. If you drink alcohol: Limit how much you have to 0-1 drink a day. Know how much alcohol is in your drink. In the [place] one drink equals one 12 oz bottle of beer (355 mL), one 5 oz glass of wine (148 mL), or one 1 oz glass of hard liquor (44 mL). Lifestyle Brush your teeth every morning and night with fluoride toothpaste. Floss one time each day. Exercise for at least 30 minutes 5 or more days each week. Do not use any products that contain nicotine or tobacco. These products include cigarettes, chewing tobacco, and vaping devices, such as e-cigarettes. If you need help quitting, ask your health care provider. Do not use drugs. If you are sexually active, practice safe sex. Use a condom or other form of protection to   prevent STIs. If you do not wish to become pregnant, use a form of birth control. If you plan to become pregnant, see your health care provider for a  prepregnancy visit. Take aspirin only as told by your health care provider. Make sure that you understand how much to take and what form to take. Work with your health care provider to find out whether it is safe and beneficial for you to take aspirin daily. Find healthy ways to manage stress, such as: Meditation, yoga, or listening to music. Journaling. Talking to a trusted person. Spending time with friends and family. Minimize exposure to UV radiation to reduce your risk of skin cancer. Safety Always wear your seat belt while driving or riding in a vehicle. Do not drive: If you have been drinking alcohol. Do not ride with someone who has been drinking. When you are tired or distracted. While texting. If you have been using any mind-altering substances or drugs. Wear a helmet and other protective equipment during sports activities. If you have firearms in your house, make sure you follow all gun safety procedures. Seek help if you have been physically or sexually abused. What's next? Visit your health care provider once a year for an annual wellness visit. Ask your health care provider how often you should have your eyes and teeth checked. Stay up to date on all vaccines. This information is not intended to replace advice given to you by your health care provider. Make sure you discuss any questions you have with your health care provider. Document Revised: 07/06/2020 Document Reviewed: 07/06/2020 Elsevier Patient Education  [place].

## 2021-09-14 ENCOUNTER — Other Ambulatory Visit: Payer: Self-pay

## 2021-09-14 MED ORDER — ALPRAZOLAM 0.25 MG PO TABS
ORAL_TABLET | ORAL | 3 refills | Status: AC
  Filled 2021-09-14: qty 30, 30d supply, fill #0

## 2021-10-03 ENCOUNTER — Other Ambulatory Visit: Payer: Self-pay

## 2021-10-26 ENCOUNTER — Other Ambulatory Visit: Payer: Self-pay

## 2021-11-09 ENCOUNTER — Other Ambulatory Visit: Payer: Self-pay

## 2021-11-09 MED ORDER — HYDROXYCHLOROQUINE SULFATE 200 MG PO TABS
ORAL_TABLET | ORAL | 5 refills | Status: DC
  Filled 2021-11-09: qty 30, 30d supply, fill #0
  Filled 2021-12-07: qty 30, 30d supply, fill #1
  Filled 2022-04-02: qty 30, 30d supply, fill #2
  Filled 2022-05-02: qty 30, 30d supply, fill #3
  Filled 2022-05-30: qty 30, 30d supply, fill #4
  Filled 2022-07-05: qty 30, 30d supply, fill #5

## 2021-11-13 ENCOUNTER — Other Ambulatory Visit: Payer: Self-pay

## 2021-11-23 ENCOUNTER — Ambulatory Visit
Admission: RE | Admit: 2021-11-23 | Discharge: 2021-11-23 | Disposition: A | Discharge status: Home / Self Care | Payer: BC Managed Care – PPO | Source: Ambulatory Visit | Attending: Certified Nurse Midwife | Admitting: Certified Nurse Midwife

## 2021-11-23 DIAGNOSIS — Z1231 Encounter for screening mammogram for malignant neoplasm of breast: Secondary | ICD-10-CM | POA: Insufficient documentation

## 2021-11-23 DIAGNOSIS — Z01419 Encounter for gynecological examination (general) (routine) without abnormal findings: Secondary | ICD-10-CM | POA: Diagnosis present

## 2021-12-04 ENCOUNTER — Encounter
Admission: RE | Disposition: A | Discharge status: Home / Self Care | Payer: Self-pay | Source: Home / Self Care | Attending: Gastroenterology

## 2021-12-04 ENCOUNTER — Ambulatory Visit: Payer: BC Managed Care – PPO | Admitting: Certified Registered"

## 2021-12-04 ENCOUNTER — Ambulatory Visit
Admission: RE | Admit: 2021-12-04 | Discharge: 2021-12-04 | Disposition: A | Discharge status: Home / Self Care | Payer: BC Managed Care – PPO | Attending: Gastroenterology | Admitting: Gastroenterology

## 2021-12-04 DIAGNOSIS — Z1211 Encounter for screening for malignant neoplasm of colon: Secondary | ICD-10-CM | POA: Diagnosis present

## 2021-12-04 DIAGNOSIS — K640 First degree hemorrhoids: Secondary | ICD-10-CM | POA: Insufficient documentation

## 2021-12-04 DIAGNOSIS — E039 Hypothyroidism, unspecified: Secondary | ICD-10-CM | POA: Insufficient documentation

## 2021-12-04 DIAGNOSIS — F419 Anxiety disorder, unspecified: Secondary | ICD-10-CM | POA: Insufficient documentation

## 2021-12-04 DIAGNOSIS — I10 Essential (primary) hypertension: Secondary | ICD-10-CM | POA: Insufficient documentation

## 2021-12-04 DIAGNOSIS — Z83719 Family history of colon polyps, unspecified: Secondary | ICD-10-CM | POA: Insufficient documentation

## 2021-12-04 HISTORY — PX: COLONOSCOPY WITH PROPOFOL: SHX5780

## 2021-12-04 SURGERY — COLONOSCOPY WITH PROPOFOL
Anesthesia: General

## 2021-12-04 MED ORDER — KETAMINE HCL 10 MG/ML IJ SOLN
INTRAMUSCULAR | Status: DC | PRN
  Administered 2021-12-04: 20 mg via INTRAVENOUS

## 2021-12-04 MED ORDER — KETAMINE HCL 50 MG/5ML IJ SOSY
PREFILLED_SYRINGE | INTRAMUSCULAR | Status: AC
  Filled 2021-12-04: qty 5

## 2021-12-04 MED ORDER — MIDAZOLAM HCL 2 MG/2ML IJ SOLN
INTRAMUSCULAR | Status: DC | PRN
  Administered 2021-12-04: 2 mg via INTRAVENOUS

## 2021-12-04 MED ORDER — SODIUM CHLORIDE 0.9 % IV SOLN
INTRAVENOUS | Status: DC
  Administered 2021-12-04: 20 mL/h via INTRAVENOUS

## 2021-12-04 MED ORDER — MIDAZOLAM HCL 2 MG/2ML IJ SOLN
INTRAMUSCULAR | Status: AC
  Filled 2021-12-04: qty 2

## 2021-12-04 MED ORDER — PROPOFOL 500 MG/50ML IV EMUL
INTRAVENOUS | Status: DC | PRN
  Administered 2021-12-04: 150 ug/kg/min via INTRAVENOUS

## 2021-12-04 MED ORDER — SODIUM CHLORIDE 0.9 % IV SOLN: INTRAVENOUS | Status: DC | PRN

## 2021-12-04 MED ORDER — PROPOFOL 10 MG/ML IV BOLUS
INTRAVENOUS | Status: DC | PRN
  Administered 2021-12-04: 80 mg via INTRAVENOUS

## 2021-12-04 NOTE — Transfer of Care (Signed)
Immediate Anesthesia Transfer of Care Note  Patient: Caitlin Washington  Procedure(s) Performed: COLONOSCOPY WITH PROPOFOL  Patient Location: PACU  Anesthesia Type:General  Level of Consciousness: drowsy  Airway & Oxygen Therapy: Patient Spontanous Breathing  Post-op Assessment: Report given to RN and Post -op Vital signs reviewed and stable  Post vital signs: Reviewed and stable  Last Vitals:  Vitals Value Taken Time  BP 108/58 12/04/21 1302  Temp 36.1 C 12/04/21 1302  Pulse 74 12/04/21 1303  Resp 12 12/04/21 1303  SpO2 100 % 12/04/21 1303  Vitals shown include unvalidated device data.  Last Pain:  Vitals:   12/04/21 1302  TempSrc: Temporal  PainSc: Asleep         Complications: No notable events documented.

## 2021-12-04 NOTE — H&P (Signed)
Outpatient short stay form Pre-procedure 12/04/2021  Lesly Rubenstein, MD  Primary Physician: Tracie Harrier, MD  Reason for visit:  Screening  History of present illness:    57 y/o lady with hypothyroidism here for screening colonoscopy. Last colonoscopy about six years ago. Both parents had polyps but no family history of GI malignancies. No blood thinners. No significant abdominal surgeries.    Current Facility-Administered Medications:    0.9 %  sodium chloride infusion, , Intravenous, Continuous, Caterina Racine, Hilton Cork, MD, Last Rate: 20 mL/hr at 12/04/21 1202, 20 mL/hr at 12/04/21 1202  Medications Prior to Admission  Medication Sig Dispense Refill Last Dose   ALPRAZolam (XANAX) 0.25 MG tablet Take 1 tablet (0.25 mg total) by mouth at bedtime as needed for Sleep 30 tablet 3 Past Week   CALCIUM PO Take by mouth.   Past Week   citalopram (CELEXA) 20 MG tablet Take 1 tablet (20 mg total) by mouth once daily 90 tablet 1 12/03/2021   cyanocobalamin (,VITAMIN B-12,) 1000 MCG/ML injection Inject 1 mL (1,000 mcg total) into the muscle once. 1 mL 0 Past Week   hydroxychloroquine (PLAQUENIL) 200 MG tablet Take 1 tablet (200 mg total) by mouth once daily 30 tablet 5 Past Week   levothyroxine (SYNTHROID) 88 MCG tablet TAKE 1 TABLET BY MOUTH ONCE DAILY ON AN EMPTY STOMACH WITH A GLASS OF WATER AT LEAST 30-60 MINUTES BEFORE BREAKFAST. 90 tablet 3 Past Week   levothyroxine (SYNTHROID) 88 MCG tablet Take 1 tablet (88 mcg total) by mouth every morning before breakfast (0630) for 180 days ON AN EMPTY STOMACH WITH A GLASS OF WATER AT LEAST 30-60 MINUTES BEFORE BREAKFAST 90 tablet 1 12/03/2021   Na Sulfate-K Sulfate-Mg Sulf 17.5-3.13-1.6 GM/177ML SOLN Take 2 Bottles (1 kit total) by mouth as directed One kit contains 2 bottles.  Take both bottles at the times instructed by your provider. 354 mL 0 Past Week   nystatin (MYCOSTATIN) 100000 UNIT/ML suspension Take 5 mLs (500,000 Units total) by mouth 4  (four) times daily. Swish for 5 minutes prior to spitting out 60 mL 0 Past Week   pantoprazole (PROTONIX) 20 MG tablet Take 1 tablet (20 mg total) by mouth once daily for 180 days 90 tablet 1 12/03/2021     Allergies  Allergen Reactions   Sulfa Antibiotics Hives   Amoxicillin Rash   Penicillins Rash     Past Medical History:  Diagnosis Date   Anemia    Anxiety    Dyspareunia in female    Hypertension    Hypothyroidism    PONV (postoperative nausea and vomiting)    after hysterectomy   PVC (premature ventricular contraction)    controlled with metoprolol   Sweating abnormality    Thyroid disease    Wears contact lenses     Review of systems:  Otherwise negative.    Physical Exam  Gen: Alert, oriented. Appears stated age.  HEENT: PERRLA. Lungs: No respiratory distress CV: RRR Abd: soft, benign, no masses Ext: No edema    Planned procedures: Proceed with colonoscopy. The patient understands the nature of the planned procedure, indications, risks, alternatives and potential complications including but not limited to bleeding, infection, perforation, damage to internal organs and possible oversedation/side effects from anesthesia. The patient agrees and gives consent to proceed.  Please refer to procedure notes for findings, recommendations and patient disposition/instructions.     Lesly Rubenstein, MD [facility] Gastroenterology

## 2021-12-04 NOTE — Anesthesia Preprocedure Evaluation (Signed)
Anesthesia Evaluation  Patient identified by MRN, date of birth, ID band Patient awake    Reviewed: Allergy & Precautions, H&P , NPO status , Patient's Chart, lab work & pertinent test results, reviewed documented beta blocker date and time   History of Anesthesia Complications (+) PONV and history of anesthetic complications  Airway Mallampati: II   Neck ROM: full    Dental  (+) Poor Dentition   Pulmonary neg pulmonary ROS   Pulmonary exam normal        Cardiovascular hypertension, On Medications negative cardio ROS Normal cardiovascular exam Rhythm:regular Rate:Normal     Neuro/Psych   Anxiety      Neuromuscular disease negative neurological ROS  negative psych ROS   GI/Hepatic negative GI ROS, Neg liver ROS,,,  Endo/Other  negative endocrine ROSHypothyroidism    Renal/GU negative Renal ROS  negative genitourinary   Musculoskeletal   Abdominal   Peds  Hematology negative hematology ROS (+) Blood dyscrasia, anemia   Anesthesia Other Findings Past Medical History: No date: Anemia No date: Anxiety No date: Dyspareunia in female No date: Hypertension No date: Hypothyroidism No date: PONV (postoperative nausea and vomiting)     Comment:  after hysterectomy No date: PVC (premature ventricular contraction)     Comment:  controlled with metoprolol No date: Sweating abnormality No date: Thyroid disease No date: Wears contact lenses Past Surgical History: No date: ABDOMINAL HYSTERECTOMY 06/23/2015: COLONOSCOPY WITH PROPOFOL; N/A     Comment:  Procedure: COLONOSCOPY WITH PROPOFOL;  Surgeon: Manya Silvas, MD;  Location: [place];  Service:               Endoscopy;  Laterality: N/A; No date: FOOT SURGERY; Right     Comment:  buion excision 12/14/2015: HALLUX VALGUS AUSTIN; Right     Comment:  Procedure: HALLUX VALGUS [place] RIGHT FOOT;  Surgeon:               [name], DPM;  Location:  [place];                Service: Podiatry;  Laterality: Right;  POPLITEAL No date: TONSILLECTOMY No date: TUBAL LIGATION BMI    Body Mass Index: 24.43 kg/m     Reproductive/Obstetrics negative OB ROS                             Anesthesia Physical Anesthesia Plan  ASA: 2  Anesthesia Plan: General   Post-op Pain Management:    Induction:   PONV Risk Score and Plan:   Airway Management Planned:   Additional Equipment:   Intra-op Plan:   Post-operative Plan:   Informed Consent: I have reviewed the patients History and Physical, chart, labs and discussed the procedure including the risks, benefits and alternatives for the proposed anesthesia with the patient or authorized representative who has indicated his/her understanding and acceptance.     Dental Advisory Given  Plan Discussed with: CRNA  Anesthesia Plan Comments:        Anesthesia Quick Evaluation

## 2021-12-04 NOTE — Op Note (Signed)
[facility] Gastroenterology Patient Name: Caitlin Washington Procedure Date: 12/04/2021 12:02 PM MRN: [id number] Account #: [id number] Date of Birth: [birth year] Admit Type: Outpatient Age: 56 Room: [facility] ENDO ROOM 3 Gender: Female Note Status: Finalized Instrument Name: [name] Riling [id number] Procedure:             Colonoscopy Indications:           Colon cancer screening in patient at increased risk:                         Family history of colon polyps in multiple 1st-degree                         relatives Providers:             Andrey Farmer MD, MD Medicines:             Monitored Anesthesia Care Complications:         No immediate complications. Procedure:             Pre-Anesthesia Assessment:                        - Prior to the procedure, a History and Physical was                         performed, and patient medications and allergies were                         reviewed. The patient is competent. The risks and                         benefits of the procedure and the sedation options and                         risks were discussed with the patient. All questions                         were answered and informed consent was obtained.                         Patient identification and proposed procedure were                         verified by the physician, the nurse, the                         anesthesiologist, the anesthetist and the technician                         in the endoscopy suite. Mental Status Examination:                         alert and oriented. Airway Examination: normal                         oropharyngeal airway and neck mobility. Respiratory                         Examination: clear to auscultation. CV Examination:  normal. Prophylactic Antibiotics: The patient does not                         require prophylactic antibiotics. Prior                         Anticoagulants: The patient has taken no  anticoagulant                         or antiplatelet agents. ASA Grade Assessment: II - A                         patient with mild systemic disease. After reviewing                         the risks and benefits, the patient was deemed in                         satisfactory condition to undergo the procedure. The                         anesthesia plan was to use monitored anesthesia care                         (MAC). Immediately prior to administration of                         medications, the patient was re-assessed for adequacy                         to receive sedatives. The heart rate, respiratory                         rate, oxygen saturations, blood pressure, adequacy of                         pulmonary ventilation, and response to care were                         monitored throughout the procedure. The physical                         status of the patient was re-assessed after the                         procedure.                        After obtaining informed consent, the colonoscope was                         passed under direct vision. Throughout the procedure,                         the patient's blood pressure, pulse, and oxygen                         saturations were monitored continuously. The  Colonoscope was introduced through the anus and                         advanced to the the cecum, identified by appendiceal                         orifice and ileocecal valve. The colonoscopy was                         performed without difficulty. The patient tolerated                         the procedure well. The quality of the bowel                         preparation was good. The ileocecal valve, appendiceal                         orifice, and rectum were photographed. Findings:      The perianal and digital rectal examinations were normal.      Internal hemorrhoids were found during retroflexion. The hemorrhoids       were Grade I  (internal hemorrhoids that do not prolapse).      The exam was otherwise without abnormality on direct and retroflexion       views. Impression:            - Internal hemorrhoids.                        - The examination was otherwise normal on direct and                         retroflexion views.                        - No specimens collected. Recommendation:        - Discharge patient to home.                        - Resume previous diet.                        - Continue present medications.                        - Repeat colonoscopy in 10 years for screening                         purposes.                        - Return to referring physician as previously                         scheduled. Procedure Code(s):     --- Professional ---                        Q6578, Colorectal cancer screening; colonoscopy on                         individual at high  risk Diagnosis Code(s):     --- Professional ---                        Z83.71, Family history of colonic polyps                        K64.0, First degree hemorrhoids CPT copyright 2022 American Medical Association. All rights reserved. The codes documented in this report are preliminary and upon coder review may  be revised to meet current compliance requirements. Andrey Farmer MD, MD 12/04/2021 1:00:01 PM Number of Addenda: 0 Note Initiated On: 12/04/2021 12:02 PM Scope Withdrawal Time: 0 hours 8 minutes 25 seconds  Total Procedure Duration: 0 hours 14 minutes 34 seconds  Estimated Blood Loss:  Estimated blood loss: none.      [facility]

## 2021-12-04 NOTE — Interval H&P Note (Signed)
History and Physical Interval Note:  12/04/2021 12:34 PM  Caitlin Washington  has presented today for surgery, with the diagnosis of familly history colon polyps.  The various methods of treatment have been discussed with the patient and family. After consideration of risks, benefits and other options for treatment, the patient has consented to  Procedure(s): COLONOSCOPY WITH PROPOFOL (N/A) as a surgical intervention.  The patient's history has been reviewed, patient examined, no change in status, stable for surgery.  I have reviewed the patient's chart and labs.  Questions were answered to the patient's satisfaction.     Lesly Rubenstein  Ok to proceed with colonoscopy

## 2021-12-05 ENCOUNTER — Encounter: Payer: Self-pay | Admitting: Gastroenterology

## 2021-12-05 NOTE — Anesthesia Postprocedure Evaluation (Signed)
Anesthesia Post Note  Patient: Caitlin Washington  Procedure(s) Performed: COLONOSCOPY WITH PROPOFOL  Patient location during evaluation: PACU Anesthesia Type: General Level of consciousness: awake and alert Pain management: pain level controlled Vital Signs Assessment: post-procedure vital signs reviewed and stable Respiratory status: spontaneous breathing, nonlabored ventilation, respiratory function stable and patient connected to nasal cannula oxygen Cardiovascular status: blood pressure returned to baseline and stable Postop Assessment: no apparent nausea or vomiting Anesthetic complications: no   No notable events documented.   Last Vitals:  Vitals:   12/04/21 1312 12/04/21 1321  BP: 129/84 (!) 141/77  Pulse: 79 70  Resp: 13 20  Temp:    SpO2: 100% 100%    Last Pain:  Vitals:   12/04/21 1321  TempSrc:   PainSc: 0-No pain                 Molli Barrows

## 2021-12-07 ENCOUNTER — Other Ambulatory Visit: Payer: Self-pay

## 2021-12-11 ENCOUNTER — Encounter: Payer: Self-pay | Admitting: Gastroenterology

## 2021-12-13 ENCOUNTER — Ambulatory Visit
Admission: RE | Admit: 2021-12-13 | Discharge: 2021-12-13 | Disposition: A | Discharge status: Home / Self Care | Payer: BC Managed Care – PPO | Source: Ambulatory Visit | Attending: Family Medicine | Admitting: Family Medicine

## 2021-12-13 VITALS — BP 125/73 | HR 97 | Temp 98.6°F | Resp 18 | Ht 67.0 in | Wt 157.0 lb

## 2021-12-13 DIAGNOSIS — Z79899 Other long term (current) drug therapy: Secondary | ICD-10-CM | POA: Diagnosis not present

## 2021-12-13 DIAGNOSIS — J069 Acute upper respiratory infection, unspecified: Secondary | ICD-10-CM | POA: Diagnosis present

## 2021-12-13 DIAGNOSIS — U071 COVID-19: Secondary | ICD-10-CM | POA: Diagnosis not present

## 2021-12-13 LAB — RESP PANEL BY RT-PCR (FLU A&B, COVID) ARPGX2
Influenza A by PCR: NEGATIVE
Influenza B by PCR: NEGATIVE
SARS Coronavirus 2 by RT PCR: POSITIVE — AB

## 2021-12-13 LAB — POCT INFLUENZA A/B
Influenza A, POC: NEGATIVE
Influenza B, POC: NEGATIVE

## 2021-12-13 MED ORDER — OSELTAMIVIR PHOSPHATE 75 MG PO CAPS: 75.0000 mg | ORAL_CAPSULE | Freq: Two times a day (BID) | ORAL | 0 refills | Status: DC

## 2021-12-13 NOTE — ED Provider Notes (Addendum)
Caitlin Washington    CSN: [id number] Arrival date & time: 12/13/21  1410      History   Chief Complaint Chief Complaint  Patient presents with   Influenza    Entered by patient    HPI Caitlin Washington is a 56 y.o. female.    Influenza  Here for nasal congestion and feeling short winded and feeling weakness.  Symptoms began yesterday.  She has had chills but has not measured any fever.  No vomiting or diarrhea, but she is nauseated  EGFR was greater than 60 earlier this year Past Medical History:  Diagnosis Date   Anemia    Anxiety    Dyspareunia in female    Hypertension    Hypothyroidism    PONV (postoperative nausea and vomiting)    after hysterectomy   PVC (premature ventricular contraction)    controlled with metoprolol   Sweating abnormality    Thyroid disease    Wears contact lenses     Patient Active Problem List   Diagnosis Date Noted   Vulvar itching 05/01/2021   Symptomatic anemia 09/12/2020   Beat, premature ventricular 10/22/2014   Anxiety, generalized 09/28/2014   Cervical radiculitis 08/17/2013   Degeneration of intervertebral disc of cervical region 08/17/2013    Past Surgical History:  Procedure Laterality Date   ABDOMINAL HYSTERECTOMY     COLONOSCOPY WITH PROPOFOL N/A 06/23/2015   Procedure: COLONOSCOPY WITH PROPOFOL;  Surgeon: Manya Silvas, MD;  Location: [place];  Service: Endoscopy;  Laterality: N/A;   COLONOSCOPY WITH PROPOFOL N/A 12/04/2021   Procedure: COLONOSCOPY WITH PROPOFOL;  Surgeon: Lesly Rubenstein, MD;  Location: [facility];  Service: Endoscopy;  Laterality: N/A;   FOOT SURGERY Right    buion excision   HALLUX VALGUS AUSTIN Right 12/14/2015   Procedure: HALLUX VALGUS AUSTIN RIGHT FOOT;  Surgeon: [name], DPM;  Location: [place];  Service: Podiatry;  Laterality: Right;  POPLITEAL   TONSILLECTOMY     TUBAL LIGATION      OB History     Gravida  3   Para  2   Term  2    Preterm      AB  1   Living  2      SAB  1   IAB      Ectopic      Multiple      Live Births  2            Home Medications    Prior to Admission medications   Medication Sig Start Date End Date Taking? Authorizing Provider  hydroxychloroquine (PLAQUENIL) 200 MG tablet Take by mouth. 11/09/21  Yes [provider]  oseltamivir (TAMIFLU) 75 MG capsule Take 1 capsule (75 mg total) by mouth every 12 (twelve) hours for 5 days. 12/13/21 12/18/21 Yes Pablo Stauffer, Gwenlyn Perking, MD  ALPRAZolam Duanne Moron) 0.25 MG tablet Take 1 tablet (0.25 mg total) by mouth at bedtime as needed for Sleep 09/14/21     CALCIUM PO Take by mouth.    [provider]  citalopram (CELEXA) 20 MG tablet Take 1 tablet (20 mg total) by mouth once daily 08/21/21     cyanocobalamin (,VITAMIN B-12,) 1000 MCG/ML injection Inject 1 mL (1,000 mcg total) into the muscle once. 03/25/15   Sable Feil, PA-C  hydroxychloroquine (PLAQUENIL) 200 MG tablet Take 1 tablet (200 mg total) by mouth once daily 11/09/21     levothyroxine (SYNTHROID) 88 MCG tablet TAKE 1 TABLET BY  MOUTH ONCE DAILY ON AN EMPTY STOMACH WITH A GLASS OF WATER AT LEAST 30-60 MINUTES BEFORE BREAKFAST. 08/30/21     levothyroxine (SYNTHROID) 88 MCG tablet Take 1 tablet (88 mcg total) by mouth every morning before breakfast (0630) for 180 days ON AN EMPTY STOMACH WITH A GLASS OF WATER AT LEAST 30-60 MINUTES BEFORE BREAKFAST 08/30/21     Na Sulfate-K Sulfate-Mg Sulf 17.5-3.13-1.6 GM/177ML SOLN Take 2 Bottles (1 kit total) by mouth as directed One kit contains 2 bottles.  Take both bottles at the times instructed by your provider. 09/04/21     nystatin (MYCOSTATIN) 100000 UNIT/ML suspension Take 5 mLs (500,000 Units total) by mouth 4 (four) times daily. Swish for 5 minutes prior to spitting out 07/16/21   Elmer Ramp, Whitney L, PA  pantoprazole (PROTONIX) 20 MG tablet Take 1 tablet (20 mg total) by mouth once daily for 180 days 08/08/21     estradiol (VIVELLE-DOT)  0.05 MG/24HR patch PLACE 1 PATCH (0.05 MG TOTAL) ONTO THE SKIN 2 (TWO) TIMES A WEEK. 09/11/21 10/26/21  Philip Aspen, CNM    Family History Family History  Problem Relation Age of Onset   Hypertension Father    Heart disease Maternal Grandmother    Heart disease Paternal Grandfather    Breast cancer Neg Hx     Social History Social History   Tobacco Use   Smoking status: Never   Smokeless tobacco: Never  Vaping Use   Vaping Use: Never used  Substance Use Topics   Alcohol use: Yes    Alcohol/week: 0.0 standard drinks of alcohol    Comment: 2 beers/month   Drug use: No     Allergies   Sulfa antibiotics, Amoxicillin, and Penicillins   Review of Systems Review of Systems   Physical Exam Triage Vital Signs ED Triage Vitals  Enc Vitals Group     BP 12/13/21 1501 (!) 151/83     Pulse Rate 12/13/21 1501 (!) 105     Resp 12/13/21 1501 18     Temp 12/13/21 1501 98.6 F (37 C)     Temp src --      SpO2 12/13/21 1501 100 %     Weight 12/13/21 1547 157 lb (71.2 kg)     Height 12/13/21 1547 _0  (1.702 m)     Head Circumference --      Peak Flow --      Pain Score 12/13/21 1547 9     Pain Loc --      Pain Edu? --      Excl. in [place]? --    No data found.  Updated Vital Signs BP (!) 151/83   Pulse (!) 105   Temp 98.6 F (37 C)   Resp 18   Ht _1  (1.702 m)   Wt 71.2 kg   SpO2 100%   BMI 24.59 kg/m   Visual Acuity Right Eye Distance:   Left Eye Distance:   Bilateral Distance:    Right Eye Near:   Left Eye Near:    Bilateral Near:     Physical Exam Vitals reviewed.  Constitutional:      General: She is not in acute distress.    Appearance: She is not toxic-appearing.  HENT:     Right Ear: Tympanic membrane and ear canal normal.     Left Ear: Tympanic membrane and ear canal normal.     Nose: Congestion present.     Mouth/Throat:     Mouth: Mucous membranes are  moist.     Comments: There is clear mucus draining and erythema of the posterior  oropharynx Eyes:     Extraocular Movements: Extraocular movements intact.     Conjunctiva/sclera: Conjunctivae normal.     Pupils: Pupils are equal, round, and reactive to light.  Cardiovascular:     Rate and Rhythm: Normal rate and regular rhythm.     Heart sounds: No murmur heard. Pulmonary:     Effort: Pulmonary effort is normal. No respiratory distress.     Breath sounds: No stridor. No wheezing, rhonchi or rales.  Musculoskeletal:     Cervical back: Neck supple.  Lymphadenopathy:     Cervical: No cervical adenopathy.  Skin:    Capillary Refill: Capillary refill takes less than 2 seconds.     Coloration: Skin is not jaundiced or pale.  Neurological:     General: No focal deficit present.     Mental Status: She is alert and oriented to person, place, and time.  Psychiatric:        Behavior: Behavior normal.      UC Treatments / Results  Labs (all labs ordered are listed, but only abnormal results are displayed) Labs Reviewed  RESP PANEL BY RT-PCR (FLU A&B, COVID) [id number]  POCT INFLUENZA A/B    EKG   Radiology No results found.  Procedures Procedures (including critical care time)  Medications Ordered in UC Medications - No data to display  Initial Impression / Assessment and Plan / UC Course  I have reviewed the triage vital signs and the nursing notes.  Pertinent labs & imaging results that were available during my care of the patient were reviewed by me and considered in my medical decision making (see chart for details).        Rapid flu is negative.  She felt more dizzy and weak during the visit and did drink some water and ate some crackers while waiting.  Vitals were rechecked.  COVID and flu swab are done.  If she is flu positive I have her a printed prescription for Tamiflu to start tomorrow, which is Thanksgiving when we are closed.  If COVID is positive she would be a candidate for Paxlovid as her last EGFR was normal at greater than  60 Final Clinical Impressions(s) / UC Diagnoses   Final diagnoses:  Viral URI     Discharge Instructions      Your flu test was negative here   You have been swabbed for COVID and flu, and the test will result in the next 24 hours. Our staff will call you if positive. If the COVID test is positive, you should quarantine for 5 days from the start of your symptoms  If this other flu test is positive, fill the printed tamiflu rx and start taking it [facility])     ED Prescriptions     Medication Sig Dispense Auth. Provider   oseltamivir (TAMIFLU) 75 MG capsule Take 1 capsule (75 mg total) by mouth every 12 (twelve) hours for 5 days. 10 capsule Barrett Henle, MD      PDMP not reviewed this encounter.   Barrett Henle, MD 12/13/21 1627    Barrett Henle, MD 12/13/21 [phone]

## 2021-12-13 NOTE — Discharge Instructions (Signed)
Your flu test was negative here   You have been swabbed for COVID and flu, and the test will result in the next 24 hours. Our staff will call you if positive. If the COVID test is positive, you should quarantine for 5 days from the start of your symptoms  If this other flu test is positive, fill the printed tamiflu rx and start taking it (check mychart)

## 2021-12-13 NOTE — ED Triage Notes (Signed)
Patient to Urgent Care with complaints of flu-like symptoms (body aches, fevers, and sore throat). Symptoms started last night.

## 2021-12-15 ENCOUNTER — Telehealth (HOSPITAL_COMMUNITY): Payer: Self-pay | Admitting: Family Medicine

## 2021-12-15 ENCOUNTER — Encounter: Payer: Self-pay | Admitting: Internal Medicine

## 2021-12-15 ENCOUNTER — Ambulatory Visit: Payer: Self-pay

## 2021-12-15 ENCOUNTER — Telehealth: Payer: Self-pay | Admitting: Urgent Care

## 2021-12-15 ENCOUNTER — Other Ambulatory Visit: Payer: Self-pay

## 2021-12-15 MED ORDER — NIRMATRELVIR/RITONAVIR (PAXLOVID)TABLET
ORAL_TABLET | ORAL | 0 refills | Status: DC
  Filled 2021-12-15: qty 30, 5d supply, fill #0

## 2021-12-15 NOTE — Telephone Encounter (Signed)
Paxlovid sent for covid pos test. Renal function normal. We will ask her to cut her dose of xanax in half if she has to take it, due to an interaction

## 2021-12-15 NOTE — Telephone Encounter (Signed)
Provider already contacted patient and prescribed Paxlovid.

## 2021-12-25 MED FILL — Iron Sucrose Inj 20 MG/ML (Fe Equiv): INTRAVENOUS | Qty: 10 | Status: AC

## 2021-12-26 ENCOUNTER — Inpatient Hospital Stay: Payer: BC Managed Care – PPO

## 2021-12-26 ENCOUNTER — Inpatient Hospital Stay: Payer: BC Managed Care – PPO | Attending: Internal Medicine | Admitting: Internal Medicine

## 2021-12-26 ENCOUNTER — Encounter: Payer: Self-pay | Admitting: Internal Medicine

## 2021-12-26 VITALS — BP 113/71 | HR 74 | Temp 100.1°F | Resp 16 | Wt 156.5 lb

## 2021-12-26 DIAGNOSIS — D649 Anemia, unspecified: Secondary | ICD-10-CM | POA: Diagnosis present

## 2021-12-26 DIAGNOSIS — M3500 Sicca syndrome, unspecified: Secondary | ICD-10-CM | POA: Diagnosis not present

## 2021-12-26 LAB — CBC WITH DIFFERENTIAL/PLATELET
Abs Immature Granulocytes: 0.01 10*3/uL (ref 0.00–0.07)
Basophils Absolute: 0 10*3/uL (ref 0.0–0.1)
Basophils Relative: 1 %
Eosinophils Absolute: 0.1 10*3/uL (ref 0.0–0.5)
Eosinophils Relative: 2 %
HCT: 39.8 % (ref 36.0–46.0)
Hemoglobin: 13.2 g/dL (ref 12.0–15.0)
Immature Granulocytes: 0 %
Lymphocytes Relative: 25 %
Lymphs Abs: 1 10*3/uL (ref 0.7–4.0)
MCH: 30.1 pg (ref 26.0–34.0)
MCHC: 33.2 g/dL (ref 30.0–36.0)
MCV: 90.9 fL (ref 80.0–100.0)
Monocytes Absolute: 0.2 10*3/uL (ref 0.1–1.0)
Monocytes Relative: 6 %
Neutro Abs: 2.6 10*3/uL (ref 1.7–7.7)
Neutrophils Relative %: 66 %
Platelets: 230 10*3/uL (ref 150–400)
RBC: 4.38 MIL/uL (ref 3.87–5.11)
RDW: 12.2 % (ref 11.5–15.5)
WBC: 4 10*3/uL (ref 4.0–10.5)
nRBC: 0 % (ref 0.0–0.2)

## 2021-12-26 LAB — BASIC METABOLIC PANEL
Anion gap: 10 (ref 5–15)
BUN: 11 mg/dL (ref 6–20)
CO2: 29 mmol/L (ref 22–32)
Calcium: 9.2 mg/dL (ref 8.9–10.3)
Chloride: 99 mmol/L (ref 98–111)
Creatinine, Ser: 0.73 mg/dL (ref 0.44–1.00)
GFR, Estimated: >60 mL/min (ref >60)
Glucose, Bld: 91 mg/dL (ref 70–99)
Potassium: 4.3 mmol/L (ref 3.5–5.1)
Sodium: 138 mmol/L (ref 135–145)

## 2021-12-26 LAB — IRON AND TIBC
Iron: 101 ug/dL (ref 28–170)
Saturation Ratios: 32 % — ABNORMAL HIGH (ref 10.4–31.8)
TIBC: 321 ug/dL (ref 250–450)
UIBC: 220 ug/dL

## 2021-12-26 LAB — FERRITIN: Ferritin: 81 ng/mL (ref 11–307)

## 2021-12-26 NOTE — Progress Notes (Signed)
Patient denies new problems/concerns today.   °

## 2021-12-26 NOTE — Assessment & Plan Note (Addendum)
#   Anemia- AUG 2022-hemoglobin 9.8 iron deficiency- hemoglobin: ferritin: 7.  s/p- IV Venofer; DEC 2022- hemoglobin is 13.2- await iron studies.  Hold off on infusion today.   #Etiology: EGD/colo [remote]; s/p repeat GI evaluation/EGD/[facility].   # ? Sjogrens-s/p Evaluation with rheumatology-on hydroxychloroquine stable.   # pt preference/ since patient is clinically stable I think is reasonable for the patient to follow-up with PCP/can follow-up with [place] as needed.  Patient comfortable with the plan; to call us if any questions or concerns in the interim.  # DISPOSITION:  # follow up as needed-  Dr.B

## 2021-12-26 NOTE — Progress Notes (Signed)
[facility] NOTE  Patient Care Team: Tracie Harrier, MD as PCP - General (Internal Medicine) Quintin Alto, MD as Consulting Physician (Rheumatology) Cammie Sickle, MD as Consulting Physician (Oncology)  CHIEF COMPLAINTS/PURPOSE OF CONSULTATION: ANEMIA   HEMATOLOGY HISTORY:  # AUG 2022 ANEMIA-hemoglobin 10 ferritin 7 [PCP]; 2017- EGD/Colo-NEG [Dr.Elliot].  P.o. and constipation.  S/p IV iron infusion.  # ?  B12 deficiency B12 injections. [pcp]  #Mild intermittent leukopenia [since 2021]-August 2022-ANC 1.3 [PCP]-asymptomatic; AUG 2022-positive ANA awaiting Rheumatology eval/Dr.Patel-? Sjogrens  HISTORY OF PRESENTING ILLNESS: Alone.  Ambulating independently.  Caitlin Washington 56 y.o.  female iron deficient anemia of unclear etiology is here for follow-up.   S/p  rheumatology evaluation-joint pains etc-on hydroxychloroquine.  Patient denies new problems/concerns today   Patient denies any worsening fatigue.  Denies any blood in stools or black or stools.   Patient energy levels are adequate.    Review of Systems  Constitutional:  Positive for malaise/fatigue. Negative for chills, diaphoresis, fever and weight loss.  HENT:  Negative for nosebleeds and sore throat.   Eyes:  Negative for double vision.  Respiratory:  Negative for cough, hemoptysis, sputum production, shortness of breath and wheezing.   Cardiovascular:  Negative for chest pain, palpitations, orthopnea and leg swelling.  Gastrointestinal:  Negative for abdominal pain, blood in stool, constipation, diarrhea, heartburn, melena, nausea and vomiting.  Genitourinary:  Positive for hematuria. Negative for dysuria, frequency and urgency.  Musculoskeletal:  Positive for joint pain and myalgias.  Skin: Negative.  Negative for itching and rash.  Neurological:  Negative for dizziness, tingling, focal weakness, weakness and headaches.  Endo/Heme/Allergies:  Does not bruise/bleed easily.   Psychiatric/Behavioral:  Negative for depression. The patient is not nervous/anxious and does not have insomnia.     MEDICAL HISTORY:  Past Medical History:  Diagnosis Date   Anemia    Anxiety    Dyspareunia in female    Hypertension    Hypothyroidism    PONV (postoperative nausea and vomiting)    after hysterectomy   PVC (premature ventricular contraction)    controlled with metoprolol   Sweating abnormality    Thyroid disease    Wears contact lenses     SURGICAL HISTORY: Past Surgical History:  Procedure Laterality Date   ABDOMINAL HYSTERECTOMY     COLONOSCOPY WITH PROPOFOL N/A 06/23/2015   Procedure: COLONOSCOPY WITH PROPOFOL;  Surgeon: Manya Silvas, MD;  Location: [place];  Service: Endoscopy;  Laterality: N/A;   COLONOSCOPY WITH PROPOFOL N/A 12/04/2021   Procedure: COLONOSCOPY WITH PROPOFOL;  Surgeon: Lesly Rubenstein, MD;  Location: [facility];  Service: Endoscopy;  Laterality: N/A;   FOOT SURGERY Right    buion excision   HALLUX VALGUS AUSTIN Right 12/14/2015   Procedure: HALLUX VALGUS AUSTIN RIGHT FOOT;  Surgeon: [name], DPM;  Location: [place];  Service: Podiatry;  Laterality: Right;  POPLITEAL   TONSILLECTOMY     TUBAL LIGATION      SOCIAL HISTORY: Social History   Socioeconomic History   Marital status: Married    Spouse name: Not on file   Number of children: Not on file   Years of education: Not on file   Highest education level: Not on file  Occupational History   Not on file  Tobacco Use   Smoking status: Never   Smokeless tobacco: Never  Vaping Use   Vaping Use: Never used  Substance and Sexual Activity   Alcohol use: Yes  Alcohol/week: 0.0 standard drinks of alcohol    Comment: 2 beers/month   Drug use: No   Sexual activity: Yes    Birth control/protection: Surgical  Other Topics Concern   Not on file  Social History Narrative   Not on file   Social Determinants of Health   Financial Resource  Strain: Not on file  Food Insecurity: Not on file  Transportation Needs: Not on file  Physical Activity: Not on file  Stress: Not on file  Social Connections: Not on file  Intimate Partner Violence: Not on file    FAMILY HISTORY: Family History  Problem Relation Age of Onset   Hypertension Father    Heart disease Maternal Grandmother    Heart disease Paternal Grandfather    Breast cancer Neg Hx     ALLERGIES:  is allergic to sulfa antibiotics, amoxicillin, and penicillins.  MEDICATIONS:  Current Outpatient Medications  Medication Sig Dispense Refill   ALPRAZolam (XANAX) 0.25 MG tablet Take 1 tablet (0.25 mg total) by mouth at bedtime as needed for Sleep 30 tablet 3   CALCIUM PO Take by mouth.     citalopram (CELEXA) 20 MG tablet Take 1 tablet (20 mg total) by mouth once daily 90 tablet 1   cyanocobalamin (,VITAMIN B-12,) 1000 MCG/ML injection Inject 1 mL (1,000 mcg total) into the muscle once. 1 mL 0   hydroxychloroquine (PLAQUENIL) 200 MG tablet Take 1 tablet (200 mg total) by mouth once daily 30 tablet 5   hydroxychloroquine (PLAQUENIL) 200 MG tablet Take by mouth.     levothyroxine (SYNTHROID) 88 MCG tablet TAKE 1 TABLET BY MOUTH ONCE DAILY ON AN EMPTY STOMACH WITH A GLASS OF WATER AT LEAST 30-60 MINUTES BEFORE BREAKFAST. 90 tablet 3   levothyroxine (SYNTHROID) 88 MCG tablet Take 1 tablet (88 mcg total) by mouth every morning before breakfast (0630) for 180 days ON AN EMPTY STOMACH WITH A GLASS OF WATER AT LEAST 30-60 MINUTES BEFORE BREAKFAST 90 tablet 1   pantoprazole (PROTONIX) 20 MG tablet Take 1 tablet (20 mg total) by mouth once daily for 180 days 90 tablet 1   Na Sulfate-K Sulfate-Mg Sulf 17.5-3.13-1.6 GM/177ML SOLN Take 2 Bottles (1 kit total) by mouth as directed One kit contains 2 bottles.  Take both bottles at the times instructed by your provider. (Patient not taking: Reported on 12/26/2021) 354 mL 0   nirmatrelvir/ritonavir EUA (PAXLOVID) 20 x 150 MG & 10 x 100MG TABS  Patient GFR is >60. Take nirmatrelvir (150 mg) two tablets twice daily for 5 days and ritonavir (100 mg) one tablet twice daily for 5 days. (Patient not taking: Reported on 12/26/2021) 30 tablet 0   nystatin (MYCOSTATIN) 100000 UNIT/ML suspension Take 5 mLs (500,000 Units total) by mouth 4 (four) times daily. Swish for 5 minutes prior to spitting out (Patient not taking: Reported on 12/26/2021) 60 mL 0   No current facility-administered medications for this visit.      PHYSICAL EXAMINATION:   Vitals:   12/26/21 1300  BP: 113/71  Pulse: 74  Resp: 16  Temp: 100.1 F (37.8 C)   Filed Weights   12/26/21 1300  Weight: 156 lb 8 oz (71 kg)    Physical Exam Vitals and nursing note reviewed.  HENT:     Head: Normocephalic and atraumatic.     Mouth/Throat:     Pharynx: Oropharynx is clear.  Eyes:     Extraocular Movements: Extraocular movements intact.     Pupils: Pupils are equal, round, and  reactive to light.  Cardiovascular:     Rate and Rhythm: Normal rate and regular rhythm.  Pulmonary:     Comments: Decreased breath sounds bilaterally.  Abdominal:     Palpations: Abdomen is soft.  Musculoskeletal:        General: Normal range of motion.     Cervical back: Normal range of motion.  Skin:    General: Skin is warm.  Neurological:     General: No focal deficit present.     Mental Status: She is alert and oriented to person, place, and time.  Psychiatric:        Behavior: Behavior normal.        Judgment: Judgment normal.     LABORATORY DATA:  I have reviewed the data as listed Lab Results  Component Value Date   WBC 4.0 12/26/2021   HGB 13.2 12/26/2021   HCT 39.8 12/26/2021   MCV 90.9 12/26/2021   PLT 230 12/26/2021   Recent Labs    06/26/21 1247 12/26/21 1328  NA 133* 138  K 4.0 4.3  CL 99 99  CO2 26 29  GLUCOSE 109* 91  BUN 13 11  CREATININE 0.66 0.73  CALCIUM 8.7* 9.2  GFRNONAA >60 >60     No results found.  Symptomatic anemia # Anemia- AUG  2022-hemoglobin 9.8 iron deficiency- hemoglobin: ferritin: 7.  s/p- IV Venofer; DEC 2022- hemoglobin is 13.2- await iron studies.  Hold off on infusion today.   #Etiology: EGD/colo [remote]; s/p repeat GI evaluation/EGD/[facility].   # ? Sjogrens-s/p Evaluation with rheumatology-on hydroxychloroquine stable.   # pt preference/ since patient is clinically stable I think is reasonable for the patient to follow-up with PCP/can follow-up with [place] as needed.  Patient comfortable with the plan; to call us if any questions or concerns in the interim.  # DISPOSITION:  # follow up as needed-  Dr.B   All questions were answered. The patient knows to call the clinic with any problems, questions or concerns.   Cammie Sickle, MD 12/26/2021 2:15 PM

## 2022-01-01 ENCOUNTER — Other Ambulatory Visit: Payer: Self-pay

## 2022-01-01 MED ORDER — BENZONATATE 100 MG PO CAPS
ORAL_CAPSULE | ORAL | 0 refills | Status: DC
  Filled 2022-01-01: qty 30, 10d supply, fill #0

## 2022-01-04 ENCOUNTER — Encounter: Payer: Self-pay | Admitting: Internal Medicine

## 2022-01-06 ENCOUNTER — Encounter: Payer: Self-pay | Admitting: Internal Medicine

## 2022-01-09 ENCOUNTER — Other Ambulatory Visit: Payer: Self-pay

## 2022-01-09 MED ORDER — HYDROCOD POLI-CHLORPHE POLI ER 10-8 MG/5ML PO SUER
ORAL | 0 refills | Status: DC
  Filled 2022-01-09: qty 70, 7d supply, fill #0

## 2022-01-09 MED ORDER — DOXYCYCLINE HYCLATE 100 MG PO TABS
ORAL_TABLET | ORAL | 0 refills | Status: DC
  Filled 2022-01-09: qty 14, 7d supply, fill #0

## 2022-01-09 MED ORDER — PREDNISONE 10 MG PO TABS
ORAL_TABLET | ORAL | 0 refills | Status: AC
  Filled 2022-01-09: qty 20, 8d supply, fill #0

## 2022-01-29 ENCOUNTER — Other Ambulatory Visit: Payer: Self-pay

## 2022-01-29 MED ORDER — PREDNISONE 10 MG PO TABS
ORAL_TABLET | ORAL | 0 refills | Status: DC
  Filled 2022-01-29: qty 18, 9d supply, fill #0

## 2022-01-29 MED ORDER — LEVOFLOXACIN 500 MG PO TABS
500.0000 mg | ORAL_TABLET | Freq: Every day | ORAL | 0 refills | Status: DC
  Filled 2022-01-29: qty 7, 7d supply, fill #0

## 2022-01-29 MED ORDER — HYDROCOD POLI-CHLORPHE POLI ER 10-8 MG/5ML PO SUER
5.0000 mL | Freq: Two times a day (BID) | ORAL | 0 refills | Status: DC | PRN
  Filled 2022-01-29: qty 70, 7d supply, fill #0

## 2022-01-29 MED ORDER — ALBUTEROL SULFATE HFA 108 (90 BASE) MCG/ACT IN AERS
2.0000 | INHALATION_SPRAY | Freq: Four times a day (QID) | RESPIRATORY_TRACT | 2 refills | Status: AC | PRN
  Filled 2022-01-29: qty 6.7, 25d supply, fill #0
  Filled 2022-05-09: qty 6.7, 25d supply, fill #1

## 2022-02-07 ENCOUNTER — Other Ambulatory Visit: Payer: Self-pay

## 2022-02-08 ENCOUNTER — Encounter: Payer: Self-pay | Admitting: Internal Medicine

## 2022-02-08 ENCOUNTER — Other Ambulatory Visit: Payer: Self-pay

## 2022-02-08 MED ORDER — PANTOPRAZOLE SODIUM 20 MG PO TBEC
DELAYED_RELEASE_TABLET | ORAL | 1 refills | Status: DC
  Filled 2022-02-08: qty 90, 90d supply, fill #0
  Filled 2022-05-27: qty 90, 90d supply, fill #1

## 2022-04-05 ENCOUNTER — Other Ambulatory Visit: Payer: Self-pay

## 2022-04-05 MED ORDER — HYDROXYCHLOROQUINE SULFATE 200 MG PO TABS
200.0000 mg | ORAL_TABLET | Freq: Every day | ORAL | 1 refills | Status: DC
  Filled 2022-04-05: qty 90, 90d supply, fill #0

## 2022-04-06 ENCOUNTER — Other Ambulatory Visit: Payer: Self-pay

## 2022-04-06 MED ORDER — CHLORHEXIDINE GLUCONATE 0.12 % MT SOLN
15.0000 mL | OROMUCOSAL | 0 refills | Status: DC
  Filled 2022-04-06: qty 473, 15d supply, fill #0

## 2022-04-06 MED ORDER — LIDOCAINE VISCOUS HCL 2 % MT SOLN
OROMUCOSAL | 2 refills | Status: DC
  Filled 2022-04-06: qty 100, 7d supply, fill #0

## 2022-04-06 MED ORDER — OXYCODONE-ACETAMINOPHEN 5-325 MG PO TABS
1.0000 | ORAL_TABLET | Freq: Four times a day (QID) | ORAL | 0 refills | Status: DC | PRN
  Filled 2022-04-06: qty 12, 5d supply, fill #0

## 2022-04-12 ENCOUNTER — Encounter: Payer: Self-pay | Admitting: Internal Medicine

## 2022-04-13 ENCOUNTER — Other Ambulatory Visit: Payer: Self-pay

## 2022-04-13 MED ORDER — LIDOCAINE VISCOUS HCL 2 % MT SOLN
OROMUCOSAL | 2 refills | Status: DC
  Filled 2022-04-13: qty 100, 7d supply, fill #0

## 2022-04-17 IMAGING — MG MM DIGITAL SCREENING BILAT W/ TOMO AND CAD
8 series · 9 of 24 positions shown · non-contrast
Comparison: Previous exam(s).

CLINICAL DATA: Screening.

EXAM:
DIGITAL SCREENING BILATERAL MAMMOGRAM WITH TOMOSYNTHESIS AND CAD
TECHNIQUE: Bilateral screening digital craniocaudal and mediolateral oblique
mammograms were obtained. Bilateral screening digital breast
tomosynthesis was performed. The images were evaluated with
computer-aided detection.

[R MLO synth-2D]
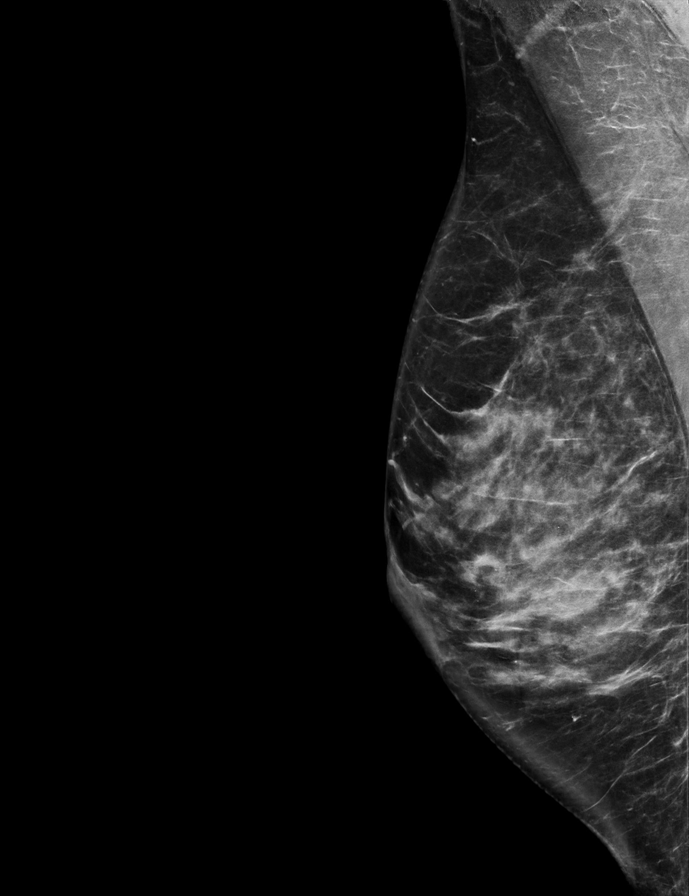

[R CC synth-2D]
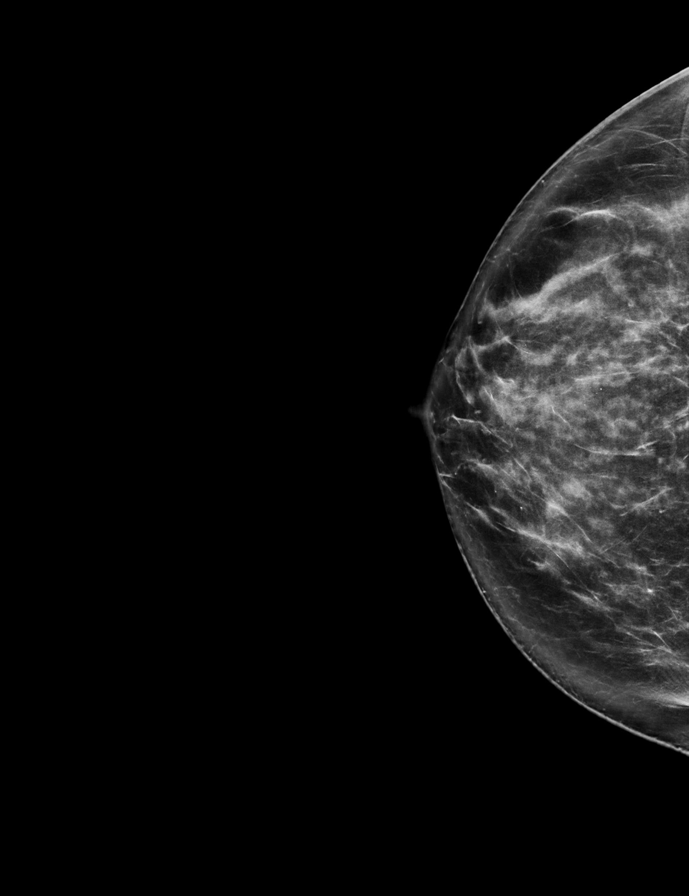

[L MLO synth-2D]
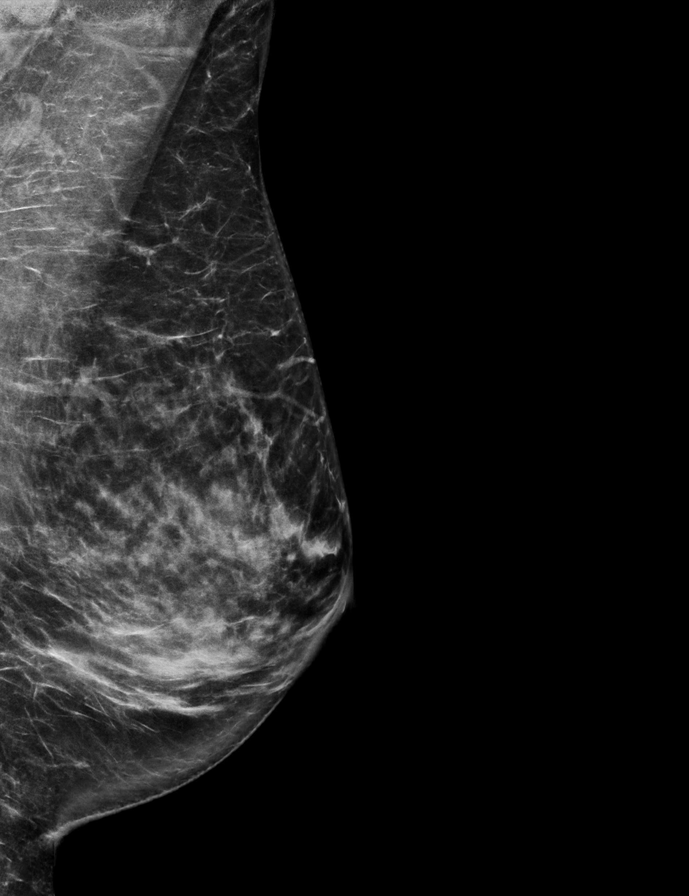

[L CC synth-2D]
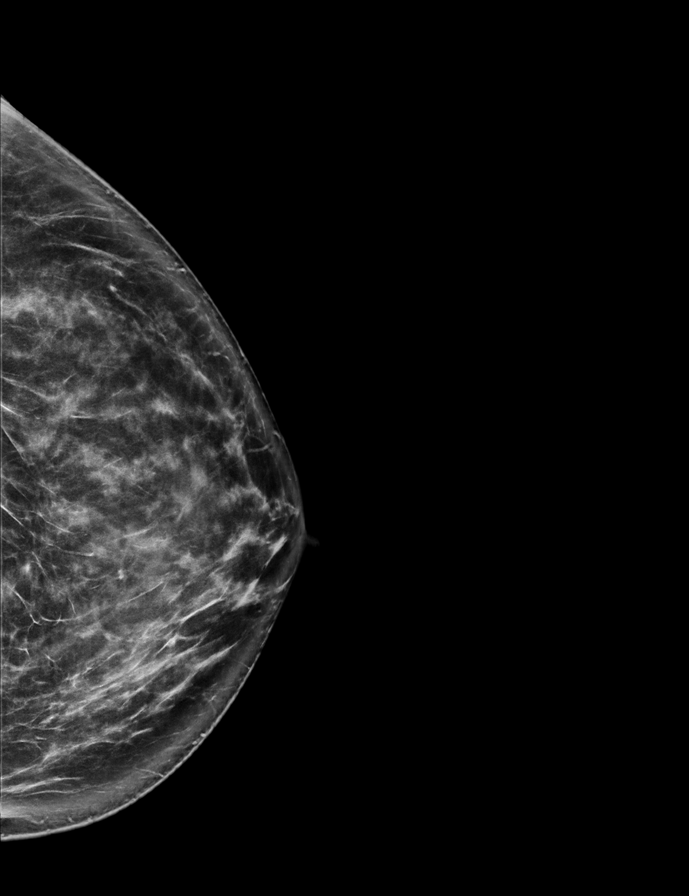

[R MLO tomo · 2 of 78 frames shown]
[frame 26/78]
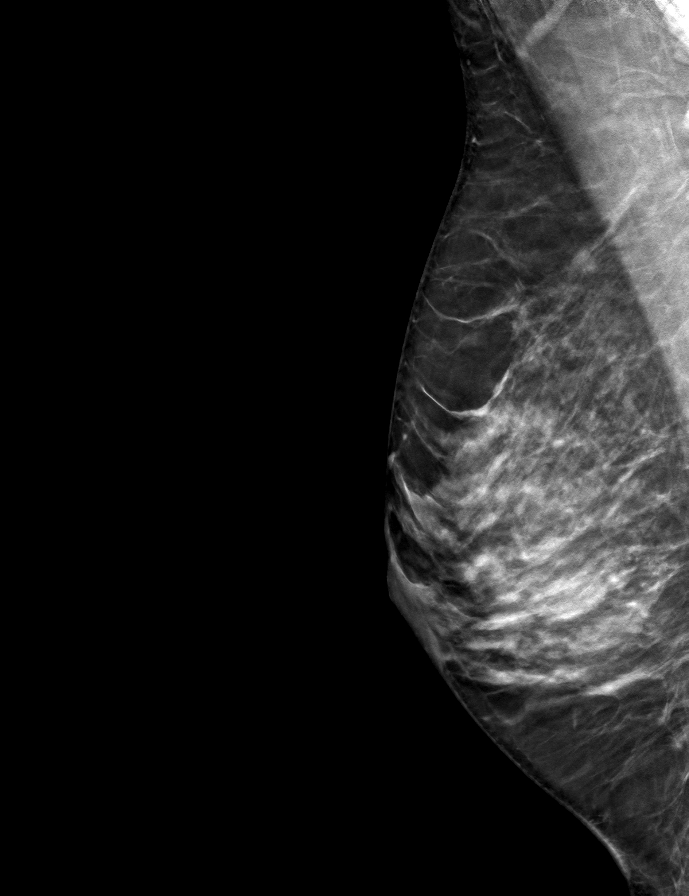
[frame 39/78]
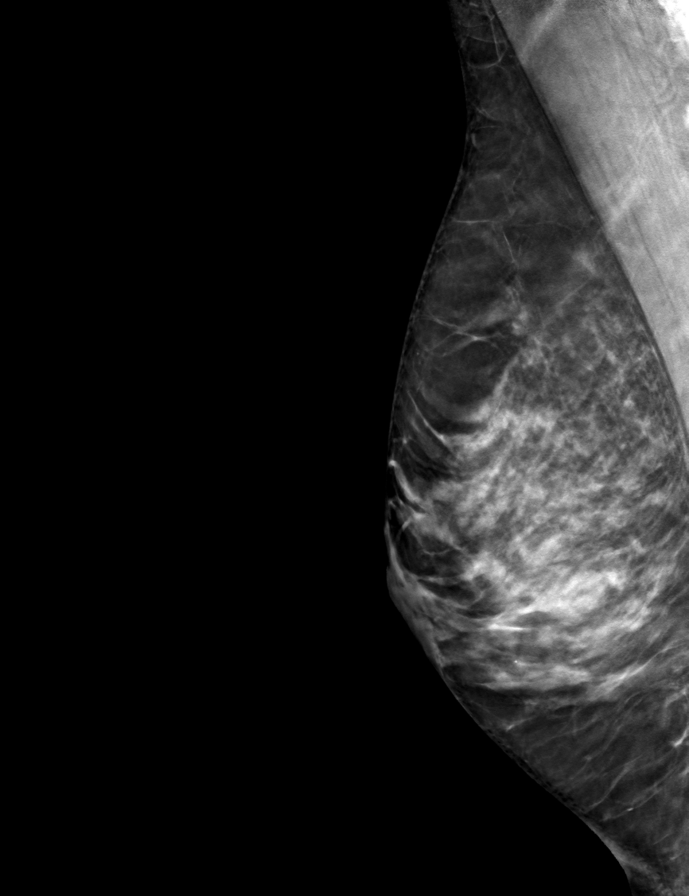

[L MLO tomo · tomo slice 37/72.0]
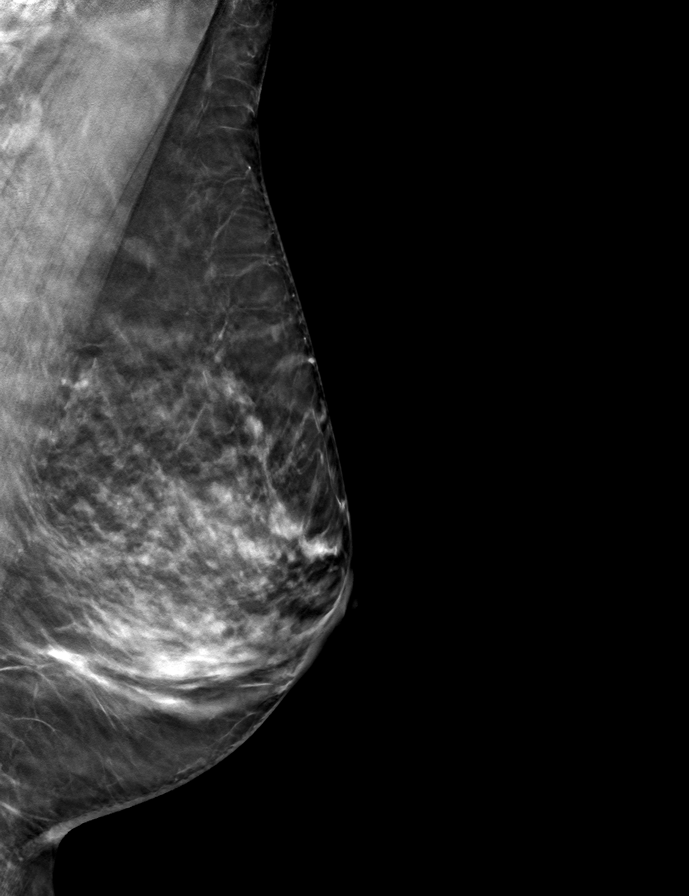

[L CC tomo · tomo slice 37/73.0]
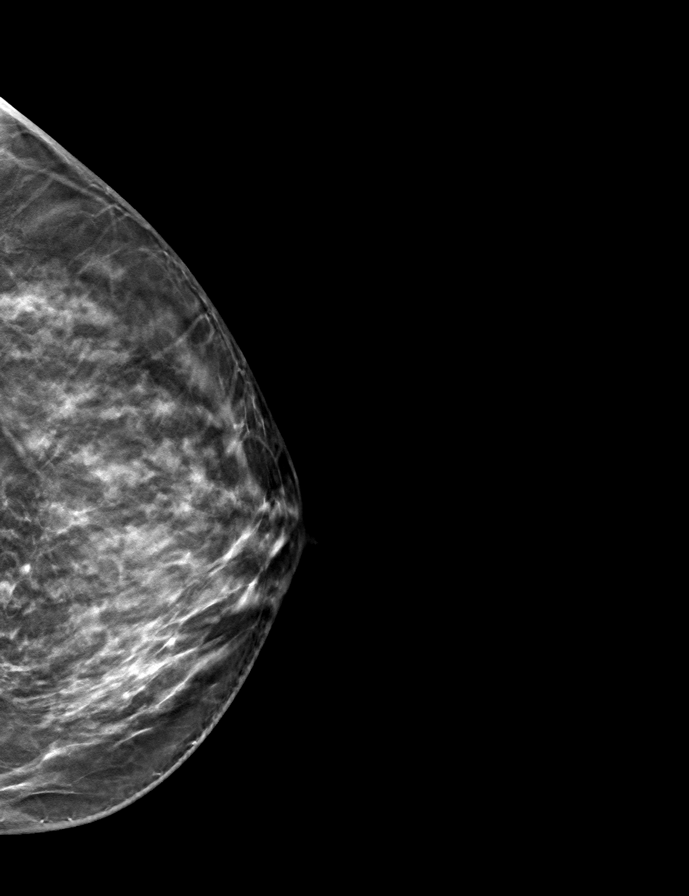

[R CC tomo · tomo slice 37/73.0]
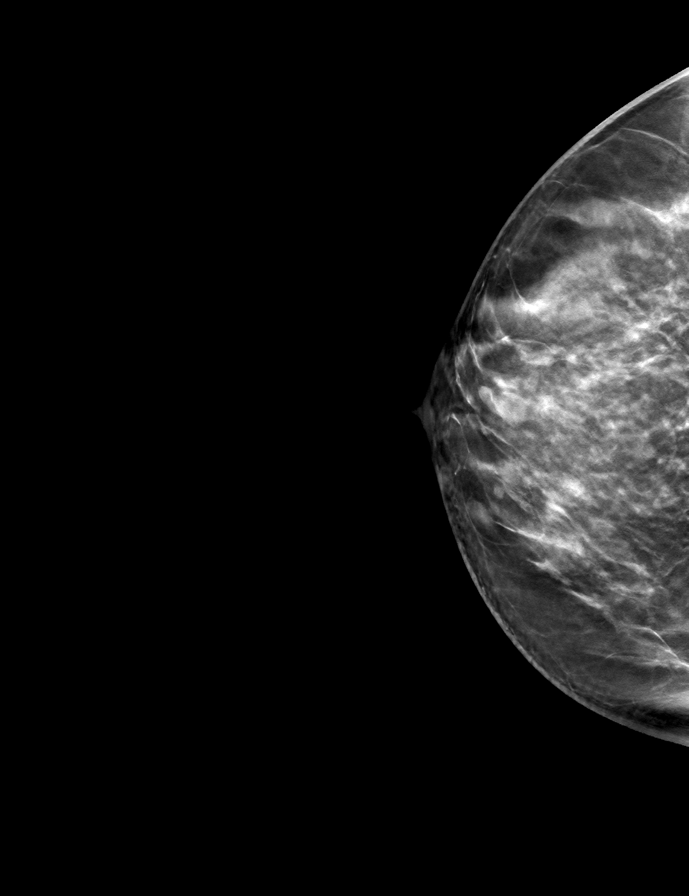

[9 of 24 positions shown; findings below may reference images not displayed]

ACR Breast Density Category c: The breast tissue is heterogeneously
dense, which may obscure small masses.
FINDINGS: There are no findings suspicious for malignancy.
IMPRESSION: No mammographic evidence of malignancy. A result letter of this
screening mammogram will be mailed directly to the patient.

RECOMMENDATION:
Screening mammogram in one year. (Code:Q3-W-BC3)

BI-RADS CATEGORY  1: Negative.

## 2022-05-09 ENCOUNTER — Other Ambulatory Visit: Payer: Self-pay

## 2022-07-06 ENCOUNTER — Other Ambulatory Visit: Payer: Self-pay

## 2022-08-17 ENCOUNTER — Other Ambulatory Visit: Payer: Self-pay

## 2022-09-03 ENCOUNTER — Other Ambulatory Visit: Payer: Self-pay

## 2022-09-04 ENCOUNTER — Other Ambulatory Visit: Payer: Self-pay

## 2022-09-04 MED ORDER — LEVOTHYROXINE SODIUM 88 MCG PO TABS
88.0000 ug | ORAL_TABLET | Freq: Every day | ORAL | 1 refills | Status: DC
  Filled 2022-09-04: qty 90, 90d supply, fill #0
  Filled 2022-12-26: qty 90, 90d supply, fill #1

## 2022-09-04 MED ORDER — PANTOPRAZOLE SODIUM 20 MG PO TBEC
20.0000 mg | DELAYED_RELEASE_TABLET | Freq: Every day | ORAL | 1 refills | Status: DC
  Filled 2022-09-04: qty 90, 90d supply, fill #0
  Filled 2022-12-26: qty 90, 90d supply, fill #1

## 2022-09-17 ENCOUNTER — Other Ambulatory Visit: Payer: Self-pay

## 2022-09-17 MED ORDER — PANTOPRAZOLE SODIUM 20 MG PO TBEC
20.0000 mg | DELAYED_RELEASE_TABLET | Freq: Every day | ORAL | 1 refills | Status: DC
  Filled 2022-09-17: qty 90, 90d supply, fill #0

## 2022-09-17 MED ORDER — LEVOTHYROXINE SODIUM 88 MCG PO TABS
88.0000 ug | ORAL_TABLET | Freq: Every morning | ORAL | 1 refills | Status: DC
  Filled 2022-09-17: qty 90, 90d supply, fill #0

## 2022-09-20 ENCOUNTER — Ambulatory Visit (INDEPENDENT_AMBULATORY_CARE_PROVIDER_SITE_OTHER): Payer: BC Managed Care – PPO | Admitting: Certified Nurse Midwife

## 2022-09-20 ENCOUNTER — Other Ambulatory Visit: Payer: Self-pay

## 2022-09-20 ENCOUNTER — Encounter: Payer: Self-pay | Admitting: Certified Nurse Midwife

## 2022-09-20 VITALS — BP 135/82 | HR 76 | Ht 67.0 in | Wt 160.1 lb

## 2022-09-20 DIAGNOSIS — R3915 Urgency of urination: Secondary | ICD-10-CM | POA: Diagnosis not present

## 2022-09-20 DIAGNOSIS — Z1231 Encounter for screening mammogram for malignant neoplasm of breast: Secondary | ICD-10-CM

## 2022-09-20 DIAGNOSIS — Z01419 Encounter for gynecological examination (general) (routine) without abnormal findings: Secondary | ICD-10-CM

## 2022-09-20 LAB — POCT URINALYSIS DIPSTICK
Bilirubin, UA: NEGATIVE
Blood, UA: NEGATIVE
Glucose, UA: NEGATIVE
Ketones, UA: NEGATIVE
Leukocytes, UA: NEGATIVE
Nitrite, UA: NEGATIVE
Protein, UA: NEGATIVE
Spec Grav, UA: <=1.005 — AB (ref 1.010–1.025)
Urobilinogen, UA: NEGATIVE E.U./dL — AB
pH, UA: 6 (ref 5.0–8.0)

## 2022-09-20 MED ORDER — ESTRADIOL 0.1 MG/GM VA CREA
1.0000 | TOPICAL_CREAM | Freq: Every day | VAGINAL | 12 refills | Status: DC
Start: 2022-09-20 — End: 2023-10-11
  Filled 2022-09-20: qty 42.5, 10d supply, fill #0
  Filled 2022-10-05: qty 42.5, 35d supply, fill #0
  Filled 2022-10-31: qty 42.5, 35d supply, fill #1
  Filled 2022-12-11: qty 42.5, 35d supply, fill #2
  Filled 2023-01-28: qty 42.5, 35d supply, fill #3
  Filled 2023-03-06: qty 42.5, 35d supply, fill #4
  Filled 2023-04-18: qty 42.5, 35d supply, fill #5
  Filled 2023-05-22: qty 42.5, 35d supply, fill #6
  Filled 2023-06-26: qty 42.5, 35d supply, fill #7
  Filled 2023-08-02: qty 42.5, 35d supply, fill #8
  Filled 2023-09-04: qty 42.5, 35d supply, fill #9

## 2022-09-20 NOTE — Patient Instructions (Signed)
Preventive Care 40-58 Years Old, Female Preventive care refers to lifestyle choices and visits with your health care provider that can promote health and wellness. Preventive care visits are also called wellness exams. What can I expect for my preventive care visit? Counseling Your health care provider may ask you questions about your: Medical history, including: Past medical problems. Family medical history. Pregnancy history. Current health, including: Menstrual cycle. Method of birth control. Emotional well-being. Home life and relationship well-being. Sexual activity and sexual health. Lifestyle, including: Alcohol, nicotine or tobacco, and drug use. Access to firearms. Diet, exercise, and sleep habits. Work and work environment. Sunscreen use. Safety issues such as seatbelt and bike helmet use. Physical exam Your health care provider will check your: Height and weight. These may be used to calculate your BMI (body mass index). BMI is a measurement that tells if you are at a healthy weight. Waist circumference. This measures the distance around your waistline. This measurement also tells if you are at a healthy weight and may help predict your risk of certain diseases, such as type 2 diabetes and high blood pressure. Heart rate and blood pressure. Body temperature. Skin for abnormal spots. What immunizations do I need?  Vaccines are usually given at various ages, according to a schedule. Your health care provider will recommend vaccines for you based on your age, medical history, and lifestyle or other factors, such as travel or where you work. What tests do I need? Screening Your health care provider may recommend screening tests for certain conditions. This may include: Lipid and cholesterol levels. Diabetes screening. This is done by checking your blood sugar (glucose) after you have not eaten for a while (fasting). Pelvic exam and Pap test. Hepatitis B test. Hepatitis C  test. HIV (human immunodeficiency virus) test. STI (sexually transmitted infection) testing, if you are at risk. Lung cancer screening. Colorectal cancer screening. Mammogram. Talk with your health care provider about when you should start having regular mammograms. This may depend on whether you have a family history of breast cancer. BRCA-related cancer screening. This may be done if you have a family history of breast, ovarian, tubal, or peritoneal cancers. Bone density scan. This is done to screen for osteoporosis. Talk with your health care provider about your test results, treatment options, and if necessary, the need for more tests. Follow these instructions at home: Eating and drinking  Eat a diet that includes fresh fruits and vegetables, whole grains, lean protein, and low-fat dairy products. Take vitamin and mineral supplements as recommended by your health care provider. Do not drink alcohol if: Your health care provider tells you not to drink. You are pregnant, may be pregnant, or are planning to become pregnant. If you drink alcohol: Limit how much you have to 0-1 drink a day. Know how much alcohol is in your drink. In the [place] one drink equals one 12 oz bottle of beer (355 mL), one 5 oz glass of wine (148 mL), or one 1 oz glass of hard liquor (44 mL). Lifestyle Brush your teeth every morning and night with fluoride toothpaste. Floss one time each day. Exercise for at least 30 minutes 5 or more days each week. Do not use any products that contain nicotine or tobacco. These products include cigarettes, chewing tobacco, and vaping devices, such as e-cigarettes. If you need help quitting, ask your health care provider. Do not use drugs. If you are sexually active, practice safe sex. Use a condom or other form of protection to   prevent STIs. If you do not wish to become pregnant, use a form of birth control. If you plan to become pregnant, see your health care provider for a  prepregnancy visit. Take aspirin only as told by your health care provider. Make sure that you understand how much to take and what form to take. Work with your health care provider to find out whether it is safe and beneficial for you to take aspirin daily. Find healthy ways to manage stress, such as: Meditation, yoga, or listening to music. Journaling. Talking to a trusted person. Spending time with friends and family. Minimize exposure to UV radiation to reduce your risk of skin cancer. Safety Always wear your seat belt while driving or riding in a vehicle. Do not drive: If you have been drinking alcohol. Do not ride with someone who has been drinking. When you are tired or distracted. While texting. If you have been using any mind-altering substances or drugs. Wear a helmet and other protective equipment during sports activities. If you have firearms in your house, make sure you follow all gun safety procedures. Seek help if you have been physically or sexually abused. What's next? Visit your health care provider once a year for an annual wellness visit. Ask your health care provider how often you should have your eyes and teeth checked. Stay up to date on all vaccines. This information is not intended to replace advice given to you by your health care provider. Make sure you discuss any questions you have with your health care provider. Document Revised: 07/06/2020 Document Reviewed: 07/06/2020 Elsevier Patient Education  2024 Elsevier Inc.  

## 2022-09-20 NOTE — Progress Notes (Signed)
GYNECOLOGY ANNUAL PREVENTATIVE CARE ENCOUNTER NOTE  History:     Caitlin Washington is a 57 y.o. G65P2012 female here for a routine annual gynecologic exam.  Current complaints: vaginal dryness, irritation, and urinary frequency.   Denies abnormal vaginal bleeding, discharge, pelvic pain, problems with intercourse or other gynecologic concerns.     Social Relationship: married  Living: spouse and son  Work:  retirement community  Exercise: walking  Smoke/Alcohol/drug use: denies use   Gynecologic History No LMP recorded. Patient has had a hysterectomy. Contraception: status post hysterectomy Last Pap: 03/2015. Results were: normal with negative HPV Last mammogram: 11/23/2021. Results were: normal  Obstetric History OB History  Gravida Para Term Preterm AB Living  3 2 2   1 2   SAB IAB Ectopic Multiple Live Births  1       2    # Outcome Date GA Lbr Len/2nd Weight Sex Type Anes PTL Lv  3 Term 2005   6 lb 2.1 oz (2.781 kg) M Vag-Spont   LIV  2 Term 2001   5 lb 2.1 oz (2.327 kg) M Vag-Spont   LIV  1 SAB 2000            Past Medical History:  Diagnosis Date   Anemia    Anxiety    Dyspareunia in female    Hypertension    Hypothyroidism    PONV (postoperative nausea and vomiting)    after hysterectomy   PVC (premature ventricular contraction)    controlled with metoprolol   Sweating abnormality    Thyroid disease    Wears contact lenses     Past Surgical History:  Procedure Laterality Date   ABDOMINAL HYSTERECTOMY     COLONOSCOPY WITH PROPOFOL N/A 06/23/2015   Procedure: COLONOSCOPY WITH PROPOFOL;  Surgeon: Scot Jun, MD;  Location: [facility] ENDOSCOPY;  Service: Endoscopy;  Laterality: N/A;   COLONOSCOPY WITH PROPOFOL N/A 12/04/2021   Procedure: COLONOSCOPY WITH PROPOFOL;  Surgeon: Regis Bill, MD;  Location: [facility] ENDOSCOPY;  Service: Endoscopy;  Laterality: N/A;   FOOT SURGERY Right    buion excision   HALLUX VALGUS AUSTIN Right 12/14/2015    Procedure: HALLUX VALGUS AUSTIN RIGHT FOOT;  Surgeon: Gwyneth Revels, DPM;  Location: [facility];  Service: Podiatry;  Laterality: Right;  POPLITEAL   TONSILLECTOMY     TUBAL LIGATION      Current Outpatient Medications on File Prior to Visit  Medication Sig Dispense Refill   albuterol (VENTOLIN HFA) 108 (90 Base) MCG/ACT inhaler Inhale 2 inhalations into the lungs every 6 (six) hours as needed for Wheezing 6.7 g 2   ALPRAZolam (XANAX) 0.25 MG tablet Take 1 tablet (0.25 mg total) by mouth at bedtime as needed for Sleep 30 tablet 3   benzonatate (TESSALON) 100 MG capsule Take 1 capsule (100 mg total) by mouth 3 (three) times daily as needed for Cough for up to 10 days 30 capsule 0   CALCIUM PO Take by mouth.     chlorhexidine (PERIDEX) 0.12 % solution Rinse mouth with 15 mLs (1 capful) for 30 seconds morning and evening after toothbrushing, spit out after rinsing. Do not swallow. 473 mL 0   chlorpheniramine-HYDROcodone (TUSSIONEX) 10-8 MG/5ML Take 5 mLs by mouth every 12 (twelve) hours as needed for up to 7 days 70 mL 0   chlorpheniramine-HYDROcodone (TUSSIONEX) 10-8 MG/5ML Take 5 mLs by mouth every 12 (twelve) hours as needed for up to 7 days 70 mL  0   citalopram (CELEXA) 20 MG tablet Take 1 tablet (20 mg total) by mouth once daily 90 tablet 1   cyanocobalamin (,VITAMIN B-12,) 1000 MCG/ML injection Inject 1 mL (1,000 mcg total) into the muscle once. 1 mL 0   doxycycline (VIBRA-TABS) 100 MG tablet Take 1 tablet (100 mg total) by mouth 2 (two) times daily for 7 days 14 tablet 0   hydroxychloroquine (PLAQUENIL) 200 MG tablet Take 1 tablet (200 mg total) by mouth once daily 30 tablet 5   hydroxychloroquine (PLAQUENIL) 200 MG tablet Take by mouth.     levofloxacin (LEVAQUIN) 500 MG tablet Take 1 tablet (500 mg total) by mouth daily for 7 days. 7 tablet 0   levothyroxine (SYNTHROID) 88 MCG tablet Take 1 tablet (88 mcg total) by mouth every morning before breakfast (0630) for 180 days ON AN  EMPTY STOMACH WITH A GLASS OF WATER AT LEAST 30-60 MINUTES BEFORE BREAKFAST 90 tablet 1   levothyroxine (SYNTHROID) 88 MCG tablet Take 1 tablet (88 mcg total) by mouth daily before breakfast with a glass of water at least 30-60 minutes before breakfast. 90 tablet 1   levothyroxine (SYNTHROID) 88 MCG tablet Take 1 tablet (88 mcg total) by mouth every morning before breakfast (0630) for 180 days ON AN EMPTY STOMACH WITH A GLASS OF WATER AT LEAST 30-60 MINUTES BEFORE BREAKFAST 90 tablet 1   lidocaine (XYLOCAINE) 2 % solution Rinse with 5 ML and spit out. 100 mL 2   lidocaine (XYLOCAINE) 2 % solution Rinse with 5ml and expectorate 100 mL 2   Na Sulfate-K Sulfate-Mg Sulf 17.5-3.13-1.6 GM/177ML SOLN Take 2 Bottles (1 kit total) by mouth as directed One kit contains 2 bottles.  Take both bottles at the times instructed by your provider. 354 mL 0   nirmatrelvir/ritonavir EUA (PAXLOVID) 20 x 150 MG & 10 x 100MG  TABS Patient GFR is >60. Take nirmatrelvir (150 mg) two tablets twice daily for 5 days and ritonavir (100 mg) one tablet twice daily for 5 days. 30 tablet 0   nystatin (MYCOSTATIN) 100000 UNIT/ML suspension Take 5 mLs (500,000 Units total) by mouth 4 (four) times daily. Swish for 5 minutes prior to spitting out 60 mL 0   oxyCODONE-acetaminophen (PERCOCET) 5-325 MG tablet Take 1 tablet by mouth every 6 (six) hours as needed. 12 tablet 0   pantoprazole (PROTONIX) 20 MG tablet Take 1 tablet (20 mg total) by mouth daily. 90 tablet 1   pantoprazole (PROTONIX) 20 MG tablet Take 1 tablet (20 mg total) by mouth daily. 90 tablet 1   predniSONE (DELTASONE) 10 MG tablet Take 3 tabs daily for 3 days, 2 tab daily for 3 days, 1 tab for 3 days 18 tablet 0   [DISCONTINUED] estradiol (VIVELLE-DOT) 0.05 MG/24HR patch PLACE 1 PATCH (0.05 MG TOTAL) ONTO THE SKIN 2 (TWO) TIMES A WEEK. 8 patch 11   No current facility-administered medications on file prior to visit.    Allergies  Allergen Reactions   Sulfa Antibiotics  Hives   Amoxicillin Rash   Penicillins Rash    Social History:  reports that she has never smoked. She has never used smokeless tobacco. She reports current alcohol use. She reports that she does not use drugs.  Family History  Problem Relation Age of Onset   Hypertension Father    Heart disease Maternal Grandmother    Heart disease Paternal Grandfather    Breast cancer Neg Hx     The following portions of the patient's history were  reviewed and updated as appropriate: allergies, current medications, past family history, past medical history, past social history, past surgical history and problem list.  Review of Systems Pertinent items noted in HPI and remainder of comprehensive ROS otherwise negative.  Physical Exam:  BP 135/82   Pulse 76   Ht 5\' 7"  (1.702 m)   Wt 160 lb 1.6 oz (72.6 kg)   BMI 25.08 kg/m  CONSTITUTIONAL: Well-developed, well-nourished female in no acute distress.  HENT:  Normocephalic, atraumatic, External right and left ear normal. Oropharynx is clear and moist EYES: Conjunctivae and EOM are normal. Pupils are equal, round, and reactive to light. No scleral icterus.  NECK: Normal range of motion, supple, no masses.  Normal thyroid.  SKIN: Skin is warm and dry. No rash noted. Not diaphoretic. No erythema. No pallor. MUSCULOSKELETAL: Normal range of motion. No tenderness.  No cyanosis, clubbing, or edema.  2+ distal pulses. NEUROLOGIC: Alert and oriented to person, place, and time. Normal reflexes, muscle tone coordination.  PSYCHIATRIC: Normal mood and affect. Normal behavior. Normal judgment and thought content. CARDIOVASCULAR: Normal heart rate noted, regular rhythm RESPIRATORY: Clear to auscultation bilaterally. Effort and breath sounds normal, no problems with respiration noted. BREASTS: Symmetric in size. No masses, tenderness, skin changes, nipple drainage, or lymphadenopathy bilaterally.  ABDOMEN: Soft, no distention noted.  No tenderness, rebound or  guarding.  PELVIC: Normal appearing external genitalia and urethral meatus; normal appearing vaginal mucosa and cervix.  No abnormal discharge noted.  Pap smear not indicated Uterus absent, cervix absent. No other palpable masses, no  adnexal tenderness.  .   Assessment and Plan:   1. Women's annual routine gynecological examination  2. Urinary urgency - POCT urinalysis dipstick  3. Screening mammogram for breast cancer - MM 3D SCREENING MAMMOGRAM BILATERAL BREAST; Future   Pap: n/a hysterectomy  Mammogram : ordered  Labs: urine culture  Refills: estrace vaginal cream  Referral: none  Routine preventative health maintenance measures emphasized. Please refer to After Visit Summary for other counseling recommendations.      Doreene Burke, CNM [facility]  [facility],  [facility]

## 2022-09-22 LAB — URINE CULTURE

## 2022-09-24 ENCOUNTER — Encounter: Payer: Self-pay | Admitting: Certified Nurse Midwife

## 2022-09-28 ENCOUNTER — Other Ambulatory Visit: Payer: Self-pay

## 2022-10-05 ENCOUNTER — Other Ambulatory Visit: Payer: Self-pay

## 2022-11-06 ENCOUNTER — Other Ambulatory Visit: Payer: Self-pay

## 2022-11-06 MED ORDER — HYDROCOD POLI-CHLORPHE POLI ER 10-8 MG/5ML PO SUER
5.0000 mL | Freq: Two times a day (BID) | ORAL | 0 refills | Status: DC
  Filled 2022-11-06: qty 70, 7d supply, fill #0

## 2022-11-06 MED ORDER — LEVOFLOXACIN 500 MG PO TABS
500.0000 mg | ORAL_TABLET | Freq: Every day | ORAL | 0 refills | Status: DC
  Filled 2022-11-06: qty 7, 7d supply, fill #0

## 2022-11-06 MED ORDER — PREDNISONE 10 MG PO TABS
ORAL_TABLET | ORAL | 0 refills | Status: DC
  Filled 2022-11-06: qty 18, 9d supply, fill #0

## 2022-11-20 ENCOUNTER — Other Ambulatory Visit: Payer: Self-pay

## 2022-11-20 MED ORDER — PANTOPRAZOLE SODIUM 20 MG PO TBEC
20.0000 mg | DELAYED_RELEASE_TABLET | Freq: Two times a day (BID) | ORAL | 1 refills | Status: DC
Start: 2022-11-20 — End: 2023-04-25
  Filled 2022-11-20: qty 60, 30d supply, fill #0

## 2022-11-21 ENCOUNTER — Other Ambulatory Visit: Payer: Self-pay

## 2022-11-21 MED ORDER — METRONIDAZOLE 0.75 % EX GEL
1.0000 | Freq: Two times a day (BID) | CUTANEOUS | 6 refills | Status: DC
  Filled 2022-11-21: qty 45, 30d supply, fill #0
  Filled 2023-03-06: qty 45, 30d supply, fill #1
  Filled 2023-06-12: qty 45, 30d supply, fill #2
  Filled 2023-09-04 (×2): qty 45, 30d supply, fill #3

## 2022-11-21 MED ORDER — GENTAMICIN SULFATE 0.1 % EX OINT
1.0000 | TOPICAL_OINTMENT | Freq: Two times a day (BID) | CUTANEOUS | 2 refills | Status: AC
  Filled 2022-11-21: qty 15, 30d supply, fill #0
  Filled 2023-06-13: qty 30, 15d supply, fill #1

## 2022-11-21 MED ORDER — TRETINOIN 0.025 % EX CREA
1.0000 | TOPICAL_CREAM | Freq: Every evening | CUTANEOUS | 6 refills | Status: AC
Start: 2022-11-21 — End: ?
  Filled 2022-11-21 – 2022-11-30 (×3): qty 20, 30d supply, fill #0

## 2022-11-22 ENCOUNTER — Other Ambulatory Visit: Payer: Self-pay

## 2022-11-22 ENCOUNTER — Encounter: Payer: Self-pay | Admitting: Internal Medicine

## 2022-11-23 ENCOUNTER — Other Ambulatory Visit: Payer: Self-pay

## 2022-11-27 ENCOUNTER — Other Ambulatory Visit: Payer: Self-pay

## 2022-11-27 ENCOUNTER — Ambulatory Visit
Admission: RE | Admit: 2022-11-27 | Discharge: 2022-11-27 | Disposition: A | Discharge status: Home / Self Care | Payer: BC Managed Care – PPO | Source: Ambulatory Visit | Attending: Certified Nurse Midwife | Admitting: Certified Nurse Midwife

## 2022-11-27 DIAGNOSIS — Z1231 Encounter for screening mammogram for malignant neoplasm of breast: Secondary | ICD-10-CM | POA: Insufficient documentation

## 2022-11-27 DIAGNOSIS — Z01419 Encounter for gynecological examination (general) (routine) without abnormal findings: Secondary | ICD-10-CM | POA: Diagnosis present

## 2022-11-30 ENCOUNTER — Other Ambulatory Visit: Payer: Self-pay

## 2023-01-22 ENCOUNTER — Other Ambulatory Visit: Payer: Self-pay

## 2023-01-22 MED ORDER — METHYLPREDNISOLONE 4 MG PO TBPK
ORAL_TABLET | ORAL | 0 refills | Status: DC
  Filled 2023-01-22: qty 21, 6d supply, fill #0

## 2023-01-22 MED ORDER — LEVOFLOXACIN 500 MG PO TABS
500.0000 mg | ORAL_TABLET | Freq: Every day | ORAL | 0 refills | Status: DC
  Filled 2023-01-22: qty 10, 10d supply, fill #0

## 2023-01-28 ENCOUNTER — Other Ambulatory Visit: Payer: Self-pay

## 2023-01-28 ENCOUNTER — Encounter: Payer: Self-pay | Admitting: Internal Medicine

## 2023-01-29 ENCOUNTER — Other Ambulatory Visit: Payer: Self-pay

## 2023-03-07 ENCOUNTER — Encounter: Payer: Self-pay | Admitting: Internal Medicine

## 2023-03-07 ENCOUNTER — Other Ambulatory Visit: Payer: Self-pay

## 2023-03-19 ENCOUNTER — Other Ambulatory Visit: Payer: Self-pay

## 2023-03-19 MED ORDER — LEVOTHYROXINE SODIUM 88 MCG PO TABS
88.0000 ug | ORAL_TABLET | Freq: Every day | ORAL | 1 refills | Status: DC
  Filled 2023-03-19: qty 90, 90d supply, fill #0
  Filled 2023-06-19: qty 90, 90d supply, fill #1

## 2023-04-24 ENCOUNTER — Other Ambulatory Visit: Payer: Self-pay

## 2023-04-24 MED ORDER — NEOMYCIN-POLYMYXIN-DEXAMETH 0.1 % OP SUSP
1.0000 [drp] | Freq: Four times a day (QID) | OPHTHALMIC | 0 refills | Status: DC
Start: 2023-04-24 — End: 2023-10-11
  Filled 2023-04-24: qty 5, 30d supply, fill #0

## 2023-04-25 ENCOUNTER — Other Ambulatory Visit: Payer: Self-pay

## 2023-04-25 MED ORDER — LEVOTHYROXINE SODIUM 88 MCG PO TABS
88.0000 ug | ORAL_TABLET | Freq: Every day | ORAL | 1 refills | Status: DC
Start: 2023-04-25 — End: 2023-10-11
  Filled 2023-04-25 – 2023-09-25 (×2): qty 90, 90d supply, fill #0

## 2023-04-25 MED ORDER — GABAPENTIN 100 MG PO CAPS
100.0000 mg | ORAL_CAPSULE | Freq: Every day | ORAL | 1 refills | Status: DC
  Filled 2023-04-25: qty 90, 90d supply, fill #0

## 2023-05-13 ENCOUNTER — Other Ambulatory Visit: Payer: Self-pay | Admitting: Internal Medicine

## 2023-05-13 DIAGNOSIS — M4807 Spinal stenosis, lumbosacral region: Secondary | ICD-10-CM

## 2023-05-14 ENCOUNTER — Ambulatory Visit
Admission: RE | Admit: 2023-05-14 | Discharge: 2023-05-14 | Disposition: A | Discharge status: Home / Self Care | Source: Ambulatory Visit | Attending: Internal Medicine | Admitting: Internal Medicine

## 2023-05-14 DIAGNOSIS — M4807 Spinal stenosis, lumbosacral region: Secondary | ICD-10-CM | POA: Insufficient documentation

## 2023-06-07 ENCOUNTER — Other Ambulatory Visit: Payer: Self-pay | Admitting: Physical Medicine and Rehabilitation

## 2023-06-07 DIAGNOSIS — M5412 Radiculopathy, cervical region: Secondary | ICD-10-CM

## 2023-06-07 DIAGNOSIS — M542 Cervicalgia: Secondary | ICD-10-CM

## 2023-06-07 DIAGNOSIS — M4802 Spinal stenosis, cervical region: Secondary | ICD-10-CM

## 2023-06-13 ENCOUNTER — Encounter: Payer: Self-pay | Admitting: Internal Medicine

## 2023-06-13 ENCOUNTER — Other Ambulatory Visit: Payer: Self-pay

## 2023-06-13 ENCOUNTER — Ambulatory Visit
Admission: RE | Admit: 2023-06-13 | Discharge: 2023-06-13 | Disposition: A | Discharge status: Home / Self Care | Source: Ambulatory Visit | Attending: Physical Medicine and Rehabilitation | Admitting: Physical Medicine and Rehabilitation

## 2023-06-13 DIAGNOSIS — M5412 Radiculopathy, cervical region: Secondary | ICD-10-CM

## 2023-06-13 DIAGNOSIS — M4802 Spinal stenosis, cervical region: Secondary | ICD-10-CM

## 2023-06-13 DIAGNOSIS — M542 Cervicalgia: Secondary | ICD-10-CM

## 2023-06-21 ENCOUNTER — Other Ambulatory Visit: Payer: Self-pay

## 2023-08-02 ENCOUNTER — Other Ambulatory Visit: Payer: Self-pay

## 2023-09-11 ENCOUNTER — Other Ambulatory Visit: Payer: Self-pay

## 2023-09-13 ENCOUNTER — Other Ambulatory Visit: Payer: Self-pay

## 2023-09-25 ENCOUNTER — Other Ambulatory Visit: Payer: Self-pay

## 2023-09-25 MED ORDER — LEVOTHYROXINE SODIUM 88 MCG PO TABS
88.0000 ug | ORAL_TABLET | Freq: Every day | ORAL | 1 refills | Status: AC
  Filled 2023-12-23: qty 90, 90d supply, fill #0

## 2023-10-10 NOTE — Progress Notes (Unsigned)
 GYNECOLOGY ANNUAL PREVENTATIVE CARE ENCOUNTER NOTE  History:     Caitlin Washington is a 58 y.o. G27P2012 female here for a routine annual gynecologic exam.  Current complaints: none.   Denies abnormal vaginal bleeding, discharge, pelvic pain, problems with intercourse or other gynecologic concerns.     Social Relationship:Married Living:husband Work:Executive Assistant Exercise:3-4x a week Smoke/Alcohol/drug use:No/ Occasional/ No  Gynecologic History No LMP recorded. Patient has had a hysterectomy. Contraception: status post hysterectomy Last Pap: 04/07/2015. Results were: normal  Last mammogram: 11/27/2022. Results were: normal  Obstetric History OB History  Gravida Para Term Preterm AB Living  3 2 2  1 2   SAB IAB Ectopic Multiple Live Births  1    2    # Outcome Date GA Lbr Len/2nd Weight Sex Type Anes PTL Lv  3 Term 2005   6 lb 2.1 oz (2.781 kg) M Vag-Spont   LIV  2 Term 2001   5 lb 2.1 oz (2.327 kg) M Vag-Spont   LIV  1 SAB 2000            Past Medical History:  Diagnosis Date   Anemia    Anxiety    Dyspareunia in female    Hypertension    Hypothyroidism    PONV (postoperative nausea and vomiting)    after hysterectomy   PVC (premature ventricular contraction)    controlled with metoprolol    Sweating abnormality    Thyroid  disease    Wears contact lenses     Past Surgical History:  Procedure Laterality Date   ABDOMINAL HYSTERECTOMY     COLONOSCOPY WITH PROPOFOL  N/A 06/23/2015   Procedure: COLONOSCOPY WITH PROPOFOL ;  Surgeon: Lamar ONEIDA Holmes, MD;  Location: [facility] ENDOSCOPY;  Service: Endoscopy;  Laterality: N/A;   COLONOSCOPY WITH PROPOFOL  N/A 12/04/2021   Procedure: COLONOSCOPY WITH PROPOFOL ;  Surgeon: Maryruth Ole ONEIDA, MD;  Location: [facility] ENDOSCOPY;  Service: Endoscopy;  Laterality: N/A;   FOOT SURGERY Right    buion excision   HALLUX VALGUS AUSTIN Right 12/14/2015   Procedure: HALLUX VALGUS AUSTIN RIGHT FOOT;  Surgeon: Eva Gay, DPM;   Location: [facility];  Service: Podiatry;  Laterality: Right;  POPLITEAL   TONSILLECTOMY     TUBAL LIGATION      Current Outpatient Medications on File Prior to Visit  Medication Sig Dispense Refill   albuterol  (VENTOLIN  HFA) 108 (90 Base) MCG/ACT inhaler Inhale 2 inhalations into the lungs every 6 (six) hours as needed for Wheezing 6.7 g 2   ALPRAZolam  (XANAX ) 0.25 MG tablet Take 1 tablet (0.25 mg total) by mouth at bedtime as needed for Sleep 30 tablet 3   CALCIUM PO Take by mouth.     cyanocobalamin  (,VITAMIN B-12,) 1000 MCG/ML injection Inject 1 mL (1,000 mcg total) into the muscle once. 1 mL 0   estradiol  (ESTRACE  VAGINAL) 0.1 MG/GM vaginal cream Place 1 Applicatorful vaginally at bedtime. I application vaginally at bedtime x 2 weeks. Then twice weekly 42.5 g 12   gentamicin  ointment (GARAMYCIN ) 0.1 % Apply 1 Application topically to facial bumps 2 (two) times daily as needed 15 g 2   levothyroxine  (SYNTHROID ) 88 MCG tablet Take 1 tablet (88 mcg total) by mouth daily before breakfast with a glass of water at least 30-60 minutes before breakfast. 90 tablet 1   tretinoin  (RETIN-A ) 0.025 % cream Apply 1 Application (pea-sized amount) topically to face Nightly. 20 g 6   [DISCONTINUED] estradiol  (VIVELLE -DOT) 0.05 MG/24HR patch PLACE  1 PATCH (0.05 MG TOTAL) ONTO THE SKIN 2 (TWO) TIMES A WEEK. 8 patch 11   No current facility-administered medications on file prior to visit.    Allergies  Allergen Reactions   Sulfa Antibiotics Hives   Amoxicillin Rash   Penicillins Rash    Social History:  reports that she has never smoked. She has never used smokeless tobacco. She reports current alcohol use. She reports that she does not use drugs.  Family History  Problem Relation Age of Onset   Hypertension Father    Heart disease Maternal Grandmother    Heart disease Paternal Grandfather    Breast cancer Neg Hx     The following portions of the patient's history were reviewed and  updated as appropriate: allergies, current medications, past family history, past medical history, past social history, past surgical history and problem list.  Review of Systems Pertinent items noted in HPI and remainder of comprehensive ROS otherwise negative.  Physical Exam:  BP 120/78   Pulse 74   Ht 5' 7 (1.702 m)   Wt 158 lb 9.6 oz (71.9 kg)   BMI 24.84 kg/m  CONSTITUTIONAL: Well-developed, well-nourished female in no acute distress.  HENT:  Normocephalic, atraumatic, External right and left ear normal. Oropharynx is clear and moist EYES: Conjunctivae and EOM are normal. Pupils are equal, round, and reactive to light. No scleral icterus.  NECK: Normal range of motion, supple, no masses.  Normal thyroid .  SKIN: Skin is warm and dry. No rash noted. Not diaphoretic. No erythema. No pallor. MUSCULOSKELETAL: Normal range of motion. No tenderness.  No cyanosis, clubbing, or edema.  2+ distal pulses. NEUROLOGIC: Alert and oriented to person, place, and time. Normal reflexes, muscle tone coordination.  PSYCHIATRIC: Normal mood and affect. Normal behavior. Normal judgment and thought content. CARDIOVASCULAR: Normal heart rate noted, regular rhythm RESPIRATORY: Clear to auscultation bilaterally. Effort and breath sounds normal, no problems with respiration noted. BREASTS: Symmetric in size. No masses, tenderness, skin changes, nipple drainage, or lymphadenopathy bilaterally.  ABDOMEN: Soft, no distention noted.  No tenderness, rebound or guarding.  PELVIC: Normal appearing external genitalia and urethral meatus; normal appearing vaginal mucosa and cervix.  No abnormal discharge noted.  Pap smear n/a hysterectomy.  Uterine absent, no other palpable masses, no  adnexal tenderness.  .   Assessment and Plan:    Annual Well Women Gyn Exam   Pap: n/a  Mammogram : ordered Colonoscopy : 2023( not due) Labs: none Refills: Estrace   Referral: none  Routine preventative health maintenance  measures emphasized. Please refer to After Visit Summary for other counseling recommendations.      Zelda Hummer, CNM [facility]  [facility],  [facility]

## 2023-10-11 ENCOUNTER — Other Ambulatory Visit: Payer: Self-pay

## 2023-10-11 ENCOUNTER — Ambulatory Visit: Admitting: Certified Nurse Midwife

## 2023-10-11 ENCOUNTER — Encounter: Payer: Self-pay | Admitting: Certified Nurse Midwife

## 2023-10-11 VITALS — BP 120/78 | HR 74 | Ht 67.0 in | Wt 158.6 lb

## 2023-10-11 DIAGNOSIS — Z01419 Encounter for gynecological examination (general) (routine) without abnormal findings: Secondary | ICD-10-CM | POA: Diagnosis not present

## 2023-10-11 DIAGNOSIS — Z1231 Encounter for screening mammogram for malignant neoplasm of breast: Secondary | ICD-10-CM

## 2023-10-11 DIAGNOSIS — Z23 Encounter for immunization: Secondary | ICD-10-CM

## 2023-10-11 MED ORDER — ESTRADIOL 0.1 MG/GM VA CREA
1.0000 | TOPICAL_CREAM | Freq: Every day | VAGINAL | 12 refills | Status: DC
  Filled 2023-10-11 – 2023-10-24 (×2): qty 42.5, 35d supply, fill #0

## 2023-10-11 NOTE — Patient Instructions (Signed)
 Preventive Care 58-59 Years Old, Female  Preventive care refers to lifestyle choices and visits with your health care provider that can promote health and wellness. Preventive care visits are also called wellness exams.  What can I expect for my preventive care visit?  Counseling  Your health care provider may ask you questions about your:  Medical history, including:  Past medical problems.  Family medical history.  Pregnancy history.  Current health, including:  Menstrual cycle.  Method of birth control.  Emotional well-being.  Home life and relationship well-being.  Sexual activity and sexual health.  Lifestyle, including:  Alcohol, nicotine or tobacco, and drug use.  Access to firearms.  Diet, exercise, and sleep habits.  Work and work Astronomer.  Sunscreen use.  Safety issues such as seatbelt and bike helmet use.  Physical exam  Your health care provider will check your:  Height and weight. These may be used to calculate your BMI (body mass index). BMI is a measurement that tells if you are at a healthy weight.  Waist circumference. This measures the distance around your waistline. This measurement also tells if you are at a healthy weight and may help predict your risk of certain diseases, such as type 2 diabetes and high blood pressure.  Heart rate and blood pressure.  Body temperature.  Skin for abnormal spots.  What immunizations do I need?    Vaccines are usually given at various ages, according to a schedule. Your health care provider will recommend vaccines for you based on your age, medical history, and lifestyle or other factors, such as travel or where you work.  What tests do I need?  Screening  Your health care provider may recommend screening tests for certain conditions. This may include:  Lipid and cholesterol levels.  Diabetes screening. This is done by checking your blood sugar (glucose) after you have not eaten for a while (fasting).  Pelvic exam and Pap test.  Hepatitis B test.  Hepatitis C  test.  HIV (human immunodeficiency virus) test.  STI (sexually transmitted infection) testing, if you are at risk.  Lung cancer screening.  Colorectal cancer screening.  Mammogram. Talk with your health care provider about when you should start having regular mammograms. This may depend on whether you have a family history of breast cancer.  BRCA-related cancer screening. This may be done if you have a family history of breast, ovarian, tubal, or peritoneal cancers.  Bone density scan. This is done to screen for osteoporosis.  Talk with your health care provider about your test results, treatment options, and if necessary, the need for more tests.  Follow these instructions at home:  Eating and drinking    Eat a diet that includes fresh fruits and vegetables, whole grains, lean protein, and low-fat dairy products.  Take vitamin and mineral supplements as recommended by your health care provider.  Do not drink alcohol if:  Your health care provider tells you not to drink.  You are pregnant, may be pregnant, or are planning to become pregnant.  If you drink alcohol:  Limit how much you have to 0-1 drink a day.  Know how much alcohol is in your drink. In the [place] one drink equals one 12 oz bottle of beer (355 mL), one 5 oz glass of wine (148 mL), or one 1 oz glass of hard liquor (44 mL).  Lifestyle  Brush your teeth every morning and night with fluoride toothpaste. Floss one time each day.  Exercise for at least  30 minutes 5 or more days each week.  Do not use any products that contain nicotine or tobacco. These products include cigarettes, chewing tobacco, and vaping devices, such as e-cigarettes. If you need help quitting, ask your health care provider.  Do not use drugs.  If you are sexually active, practice safe sex. Use a condom or other form of protection to prevent STIs.  If you do not wish to become pregnant, use a form of birth control. If you plan to become pregnant, see your health care provider for a  prepregnancy visit.  Take aspirin only as told by your health care provider. Make sure that you understand how much to take and what form to take. Work with your health care provider to find out whether it is safe and beneficial for you to take aspirin daily.  Find healthy ways to manage stress, such as:  Meditation, yoga, or listening to music.  Journaling.  Talking to a trusted person.  Spending time with friends and family.  Minimize exposure to UV radiation to reduce your risk of skin cancer.  Safety  Always wear your seat belt while driving or riding in a vehicle.  Do not drive:  If you have been drinking alcohol. Do not ride with someone who has been drinking.  When you are tired or distracted.  While texting.  If you have been using any mind-altering substances or drugs.  Wear a helmet and other protective equipment during sports activities.  If you have firearms in your house, make sure you follow all gun safety procedures.  Seek help if you have been physically or sexually abused.  What's next?  Visit your health care provider once a year for an annual wellness visit.  Ask your health care provider how often you should have your eyes and teeth checked.  Stay up to date on all vaccines.  This information is not intended to replace advice given to you by your health care provider. Make sure you discuss any questions you have with your health care provider.  Document Revised: 07/06/2020 Document Reviewed: 07/06/2020  Elsevier Patient Education  2024 [employer].

## 2023-10-22 ENCOUNTER — Other Ambulatory Visit: Payer: Self-pay

## 2023-10-24 ENCOUNTER — Other Ambulatory Visit: Payer: Self-pay

## 2023-10-25 ENCOUNTER — Other Ambulatory Visit: Payer: Self-pay

## 2023-10-28 ENCOUNTER — Other Ambulatory Visit: Payer: Self-pay

## 2023-10-30 ENCOUNTER — Other Ambulatory Visit: Payer: Self-pay

## 2023-10-31 ENCOUNTER — Other Ambulatory Visit: Payer: Self-pay

## 2023-10-31 MED ORDER — ROPINIROLE HCL 0.25 MG PO TABS
0.2500 mg | ORAL_TABLET | Freq: Three times a day (TID) | ORAL | 3 refills | Status: DC
  Filled 2023-10-31: qty 30, 10d supply, fill #0

## 2023-11-01 ENCOUNTER — Other Ambulatory Visit: Payer: Self-pay

## 2023-11-01 MED ORDER — POLYSACCHARIDE IRON COMPLEX 150 MG PO CAPS
150.0000 mg | ORAL_CAPSULE | Freq: Every day | ORAL | 2 refills | Status: DC
  Filled 2023-11-01: qty 30, 30d supply, fill #0
  Filled 2023-11-28: qty 30, 30d supply, fill #1
  Filled 2024-01-25: qty 30, 30d supply, fill #2

## 2023-11-04 ENCOUNTER — Other Ambulatory Visit: Payer: Self-pay

## 2023-11-05 ENCOUNTER — Other Ambulatory Visit: Payer: Self-pay

## 2023-11-06 ENCOUNTER — Other Ambulatory Visit: Payer: Self-pay

## 2023-11-07 ENCOUNTER — Other Ambulatory Visit: Payer: Self-pay

## 2023-11-13 ENCOUNTER — Other Ambulatory Visit: Payer: Self-pay

## 2023-11-29 ENCOUNTER — Other Ambulatory Visit: Payer: Self-pay

## 2023-11-29 ENCOUNTER — Encounter: Payer: Self-pay | Admitting: Internal Medicine

## 2023-12-04 ENCOUNTER — Ambulatory Visit
Admission: RE | Admit: 2023-12-04 | Discharge: 2023-12-04 | Disposition: A | Discharge status: Home / Self Care | Source: Ambulatory Visit | Attending: Certified Nurse Midwife | Admitting: Certified Nurse Midwife

## 2023-12-04 DIAGNOSIS — Z1231 Encounter for screening mammogram for malignant neoplasm of breast: Secondary | ICD-10-CM | POA: Diagnosis present

## 2023-12-16 ENCOUNTER — Other Ambulatory Visit: Payer: Self-pay

## 2023-12-16 MED ORDER — TRAZODONE HCL 50 MG PO TABS
50.0000 mg | ORAL_TABLET | Freq: Every day | ORAL | 5 refills | Status: AC
  Filled 2023-12-16: qty 30, 30d supply, fill #0
  Filled 2024-01-14: qty 30, 30d supply, fill #1

## 2023-12-23 ENCOUNTER — Other Ambulatory Visit: Payer: Self-pay

## 2023-12-23 MED ORDER — METRONIDAZOLE 0.75 % EX GEL
CUTANEOUS | 6 refills | Status: AC
  Filled 2023-12-23: qty 45, 30d supply, fill #0

## 2023-12-24 ENCOUNTER — Other Ambulatory Visit: Payer: Self-pay | Admitting: Podiatry

## 2024-01-01 ENCOUNTER — Other Ambulatory Visit: Payer: Self-pay

## 2024-01-02 ENCOUNTER — Other Ambulatory Visit: Payer: Self-pay

## 2024-01-03 ENCOUNTER — Encounter: Payer: Self-pay | Admitting: Podiatry

## 2024-01-06 NOTE — Discharge Instructions (Signed)

## 2024-01-08 ENCOUNTER — Ambulatory Visit: Payer: Self-pay | Admitting: Anesthesiology

## 2024-01-08 ENCOUNTER — Encounter: Admission: RE | Disposition: A | Payer: Self-pay | Source: Home / Self Care | Attending: Podiatry

## 2024-01-08 ENCOUNTER — Other Ambulatory Visit: Payer: Self-pay

## 2024-01-08 ENCOUNTER — Ambulatory Visit
Admission: RE | Admit: 2024-01-08 | Discharge: 2024-01-08 | Disposition: A | Source: Home / Self Care | Attending: Podiatry | Admitting: Podiatry

## 2024-01-08 ENCOUNTER — Ambulatory Visit: Payer: Self-pay

## 2024-01-08 ENCOUNTER — Encounter: Payer: Self-pay | Admitting: Podiatry

## 2024-01-08 DIAGNOSIS — M1990 Unspecified osteoarthritis, unspecified site: Secondary | ICD-10-CM | POA: Insufficient documentation

## 2024-01-08 DIAGNOSIS — G709 Myoneural disorder, unspecified: Secondary | ICD-10-CM | POA: Insufficient documentation

## 2024-01-08 DIAGNOSIS — F419 Anxiety disorder, unspecified: Secondary | ICD-10-CM | POA: Insufficient documentation

## 2024-01-08 DIAGNOSIS — E039 Hypothyroidism, unspecified: Secondary | ICD-10-CM | POA: Insufficient documentation

## 2024-01-08 DIAGNOSIS — M2012 Hallux valgus (acquired), left foot: Secondary | ICD-10-CM | POA: Insufficient documentation

## 2024-01-08 DIAGNOSIS — G4733 Obstructive sleep apnea (adult) (pediatric): Secondary | ICD-10-CM | POA: Insufficient documentation

## 2024-01-08 HISTORY — PX: HALLUX VALGUS AUSTIN: SHX6623

## 2024-01-08 SURGERY — CORRECTION, HALLUX VALGUS
Anesthesia: General | Site: Foot | Laterality: Left

## 2024-01-08 MED ORDER — PROPOFOL 10 MG/ML IV BOLUS
INTRAVENOUS | Status: DC | PRN
  Administered 2024-01-08: 14:00:00 150 mg via INTRAVENOUS
  Administered 2024-01-08: 14:00:00 50 mg via INTRAVENOUS

## 2024-01-08 MED ORDER — ONDANSETRON HCL 4 MG/2ML IJ SOLN
INTRAMUSCULAR | Status: AC
  Filled 2024-01-08: qty 2

## 2024-01-08 MED ORDER — HALOPERIDOL LACTATE 5 MG/ML IJ SOLN
INTRAMUSCULAR | Status: DC | PRN
  Administered 2024-01-08: 14:00:00 .5 mg via INTRAVENOUS

## 2024-01-08 MED ORDER — ONDANSETRON HCL 4 MG/2ML IJ SOLN
4.0000 mg | Freq: Once | INTRAMUSCULAR | Status: AC
  Administered 2024-01-08: 12:00:00 4 mg via INTRAVENOUS

## 2024-01-08 MED ORDER — PHENYLEPHRINE HCL (PRESSORS) 10 MG/ML IV SOLN
INTRAVENOUS | Status: DC | PRN
  Administered 2024-01-08 (×2): 80 ug via INTRAVENOUS

## 2024-01-08 MED ORDER — CEFAZOLIN SODIUM-DEXTROSE 2-4 GM/100ML-% IV SOLN
2.0000 g | INTRAVENOUS | Status: AC
  Administered 2024-01-08: 14:00:00 2 g via INTRAVENOUS

## 2024-01-08 MED ORDER — MIDAZOLAM HCL 5 MG/5ML IJ SOLN
INTRAMUSCULAR | Status: DC | PRN
  Administered 2024-01-08: 13:00:00 2 mg via INTRAVENOUS

## 2024-01-08 MED ORDER — LACTATED RINGERS IV SOLN: INTRAVENOUS | Status: DC

## 2024-01-08 MED ORDER — SODIUM CHLORIDE 0.9 % IR SOLN
Status: DC | PRN
  Administered 2024-01-08: 14:00:00 500 mL

## 2024-01-08 MED ORDER — OXYCODONE-ACETAMINOPHEN 5-325 MG PO TABS
1.0000 | ORAL_TABLET | Freq: Four times a day (QID) | ORAL | 0 refills | Status: AC | PRN
  Filled 2024-01-08: qty 30, 6d supply, fill #0

## 2024-01-08 MED ORDER — SCOPOLAMINE 1 MG/3DAYS TD PT72
MEDICATED_PATCH | TRANSDERMAL | Status: DC | PRN
  Administered 2024-01-08: 13:00:00 1 via TRANSDERMAL

## 2024-01-08 MED ORDER — BUPIVACAINE LIPOSOME 1.3 % IJ SUSP
INTRAMUSCULAR | Status: AC
  Filled 2024-01-08: qty 10

## 2024-01-08 MED ORDER — BUPIVACAINE LIPOSOME 1.3 % IJ SUSP
INTRAMUSCULAR | Status: DC | PRN
  Administered 2024-01-08: 14:00:00 10 mL

## 2024-01-08 MED ORDER — LIDOCAINE HCL (CARDIAC) PF 100 MG/5ML IV SOSY
PREFILLED_SYRINGE | INTRAVENOUS | Status: DC | PRN
  Administered 2024-01-08: 14:00:00 50 mg via INTRATRACHEAL

## 2024-01-08 MED ORDER — FENTANYL CITRATE (PF) 100 MCG/2ML IJ SOLN
INTRAMUSCULAR | Status: AC
  Filled 2024-01-08: qty 2

## 2024-01-08 MED ORDER — MIDAZOLAM HCL 2 MG/2ML IJ SOLN
INTRAMUSCULAR | Status: AC
  Filled 2024-01-08: qty 2

## 2024-01-08 MED ORDER — FENTANYL CITRATE (PF) 100 MCG/2ML IJ SOLN
INTRAMUSCULAR | Status: DC | PRN
  Administered 2024-01-08: 14:00:00 100 ug via INTRAVENOUS

## 2024-01-08 MED ORDER — CEFAZOLIN SODIUM-DEXTROSE 2-3 GM-%(50ML) IV SOLR
INTRAVENOUS | Status: AC
  Filled 2024-01-08: qty 50

## 2024-01-08 MED ORDER — BUPIVACAINE HCL (PF) 0.25 % IJ SOLN
INTRAMUSCULAR | Status: DC | PRN
  Administered 2024-01-08: 14:00:00 10 mL

## 2024-01-08 MED ORDER — DEXAMETHASONE SODIUM PHOSPHATE 4 MG/ML IJ SOLN
INTRAMUSCULAR | Status: DC | PRN
  Administered 2024-01-08: 14:00:00 4 mg via INTRAVENOUS

## 2024-01-08 SURGICAL SUPPLY — 37 items
3.0 x 12 mmn Cann Screw L#135661 (Screw) IMPLANT
3.0 x 14 mm Cann Screw (Screw) IMPLANT
3.0 x 14 mm Cann Screw L#135662 (Screw) IMPLANT
BENZOIN TINCTURE PRP APPL 2/3 (GAUZE/BANDAGES/DRESSINGS) ×1 IMPLANT
BIT DRILL 2.1 LONG CANN (MISCELLANEOUS) IMPLANT
BLADE MED AGGRESSIVE (BLADE) IMPLANT
BNDG COHESIVE 4X5 TAN STRL LF (GAUZE/BANDAGES/DRESSINGS) ×1 IMPLANT
BNDG ELASTIC 4X5.8 VLCR NS LF (GAUZE/BANDAGES/DRESSINGS) ×1 IMPLANT
BNDG ESMARCH 4X12 STRL LF (GAUZE/BANDAGES/DRESSINGS) ×1 IMPLANT
BNDG GAUZE DERMACEA FLUFF 4 (GAUZE/BANDAGES/DRESSINGS) ×1 IMPLANT
BNDG STRETCH 4X75 STRL LF (GAUZE/BANDAGES/DRESSINGS) ×1 IMPLANT
BOOT WALKER SMALL (SOFTGOODS) IMPLANT
CANISTER SUCT 1200ML W/VALVE (MISCELLANEOUS) ×1 IMPLANT
COVER LIGHT HANDLE UNIVERSAL (MISCELLANEOUS) ×2 IMPLANT
DRAPE FLUOR MINI C-ARM 54X84 (DRAPES) ×1 IMPLANT
DURAPREP 26ML APPLICATOR (WOUND CARE) ×1 IMPLANT
ELECTRODE REM PT RTRN 9FT ADLT (ELECTROSURGICAL) ×1 IMPLANT
GAUZE SPONGE 4X4 12PLY STRL (GAUZE/BANDAGES/DRESSINGS) ×1 IMPLANT
GAUZE XEROFORM 1X8 LF (GAUZE/BANDAGES/DRESSINGS) ×1 IMPLANT
GLOVE BIOGEL PI IND STRL 8 (GLOVE) ×2 IMPLANT
GLOVE SURG SS PI 7.5 STRL IVOR (GLOVE) ×2 IMPLANT
GOWN SPEC L4 XLG W/TWL (GOWN DISPOSABLE) ×1 IMPLANT
GOWN STRL REUS W/ TWL LRG LVL3 (GOWN DISPOSABLE) ×1 IMPLANT
KIT TURNOVER KIT A (KITS) ×1 IMPLANT
KWIRE SINGLE SMOOTH 1.1X150MM (WIRE) IMPLANT
NS IRRIG 500ML POUR BTL (IV SOLUTION) ×1 IMPLANT
PACK EXTREMITY ARMC (MISCELLANEOUS) ×1 IMPLANT
PENCIL SMOKE EVACUATOR (MISCELLANEOUS) ×1 IMPLANT
RASP SM TEAR CROSS CUT (RASP) IMPLANT
SCREW CANN HEAD MM 3X12 (Screw) IMPLANT
SCREW CANN HEAD MM 3X14 (Screw) IMPLANT
SCREW HEADED COUNTER SINK 3.0 IMPLANT
STOCKINETTE IMPERVIOUS LG (DRAPES) ×1 IMPLANT
STRIP CLOSURE SKIN 1/4X4 (GAUZE/BANDAGES/DRESSINGS) ×1 IMPLANT
SUT VIC AB 3-0 SH 27X BRD (SUTURE) IMPLANT
SUT VIC AB 4-0 SH 27XANBCTRL (SUTURE) IMPLANT
SUTURE MNCRL 4-0 27XMF (SUTURE) ×1 IMPLANT

## 2024-01-08 NOTE — Anesthesia Procedure Notes (Signed)
 Procedure Name: LMA Insertion Date/Time: 01/08/2024 1:33 PM  Performed by: Levy Harvey, CRNAPre-anesthesia Checklist: Patient identified, Emergency Drugs available, Suction available, Timeout performed and Patient being monitored Patient Re-evaluated:Patient Re-evaluated prior to induction Oxygen Delivery Method: Circle system utilized Preoxygenation: Pre-oxygenation with 100% oxygen Induction Type: IV induction LMA: LMA inserted LMA Size: 4.0 Number of attempts: 1 Placement Confirmation: positive ETCO2 and breath sounds checked- equal and bilateral Tube secured with: Tape

## 2024-01-08 NOTE — Anesthesia Postprocedure Evaluation (Signed)
 Anesthesia Post Note  Patient: Caitlin Washington  Procedure(s) Performed: CORRECTION, HALLUX VALGUS (Left: Foot)  Patient location during evaluation: PACU Anesthesia Type: General Level of consciousness: awake and alert Pain management: pain level controlled Vital Signs Assessment: post-procedure vital signs reviewed and stable Respiratory status: spontaneous breathing, nonlabored ventilation, respiratory function stable and patient connected to nasal cannula oxygen Cardiovascular status: blood pressure returned to baseline and stable Postop Assessment: no apparent nausea or vomiting Anesthetic complications: no   No notable events documented.   Last Vitals:  Vitals:   01/08/24 1208 01/08/24 1445  BP: 137/77 129/75  Pulse: 81 96  Resp: 16 16  Temp: 36.8 C 36.6 C  SpO2: 100% 99%    Last Pain:  Vitals:   01/08/24 1445  TempSrc:   PainSc: 0-No pain                 Andrea Ferrer C Fredis Malkiewicz

## 2024-01-08 NOTE — H&P (Signed)
 HISTORY AND PHYSICAL INTERVAL NOTE:  01/08/2024  12:04 PM  Caitlin Washington  has presented today for surgery, with the diagnosis of Hallux valgus of left foot M20.12 Left foot pain M79.672.  The various methods of treatment have been discussed with the patient.  No guarantees were given.  After consideration of risks, benefits and other options for treatment, the patient has consented to surgery.  I have reviewed the patients chart and labs.     A history and physical examination was performed in my office.  The patient was reexamined.  There have been no changes to this history and physical examination.  Caitlin Washington A

## 2024-01-08 NOTE — Op Note (Signed)
 Operative note   Surgeon:Anaiya Wisinski [employer]/support/administrative Officer: None    Preop diagnosis: Left foot hallux valgus deformity    Postop diagnosis: Same    Procedure: 1.  [place] hallux valgus correction left foot 2.  Intraoperative fluoroscopy without radiology assistance    EBL: Minimal    Anesthesia: General With local.  Local consists of a total of 20 cc of one-to-one mixture of 0.25% plain bupivacaine  and Exparel  long-acting anesthetic and a local block fashion    Hemostasis: Mid calf tourniquet inflated to 200 mmHg for approximately 50 minutes    Specimen: None    Complications: None    Operative indications:Caitlin Washington is an 58 y.o. that presents today for surgical intervention.  The risks/benefits/alternatives/complications have been discussed and consent has been given.    Procedure:  Patient was brought into the OR and placed on the operating table in thesupine position. After anesthesia was obtained theleft lower extremity was prepped and draped in usual sterile fashion.  Attention was directed to the left foot where an incision was placed along the dorsal medial aspect of the first MTPJ.  Sharp and blunt dissection get down to the capsule.  The intermetatarsal space was entered.  The DTI L was transected.  The conjoined tendon of the abductor was then released.  The medial capsule was then opened with a longitudinal incision with a small T capsulotomy performed.  The dorsal medial eminence was noted and transected with a power saw.  Next a guidewire was used for the apical access guide.  A V osteotomy was created.  The capital fragment was translocated laterally.  This was then stabilized with a 3 oh millimeter cannulated Paragon screw.  Good stability and alignment and compression was noted.  The ensuing overhanging eminence was then transected with a power saw and smoothed with a power rasp.  There was then flushed with copious amounts of irrigation.  Fluoroscopy was used  intraoperatively to reveal the reduction of the prominence and realignment of the great toe.  A small capsulorrhaphy was performed.  The capsule was then closed with a 3-0 Vicryl.  The subcutaneous tissue with a 4-0 Vicryl and the skin with a 4-0 Monocryl.  A bulky sterile dressing was applied.  Patient was placed in an equalizer walker boot.    Patient tolerated the procedure and anesthesia well.  Was transported from the OR to the PACU with all vital signs stable and vascular status intact. To be discharged per routine protocol.  Will follow up in approximately 1 week in the outpatient clinic.

## 2024-01-08 NOTE — Transfer of Care (Signed)
 Immediate Anesthesia Transfer of Care Note  Patient: Caitlin Washington  Procedure(s) Performed: CORRECTION, HALLUX VALGUS (Left: Foot)  Patient Location: PACU  Anesthesia Type: General  Level of Consciousness: awake, alert  and patient cooperative  Airway and Oxygen Therapy: Patient Spontanous Breathing and Patient connected to supplemental oxygen  Post-op Assessment: Post-op Vital signs reviewed, Patient's Cardiovascular Status Stable, Respiratory Function Stable, Patent Airway and No signs of Nausea or vomiting  Post-op Vital Signs: Reviewed and stable  Complications: No notable events documented.

## 2024-01-08 NOTE — Anesthesia Preprocedure Evaluation (Signed)
 Anesthesia Evaluation  Patient identified by MRN, date of birth, ID band Patient awake    Reviewed: Allergy & Precautions, H&P , NPO status , Patient's Chart, lab work & pertinent test results  History of Anesthesia Complications (+) PONV and history of anesthetic complications  Airway Mallampati: III  TM Distance: <3 FB Neck ROM: Full    Dental no notable dental hx.    Pulmonary neg pulmonary ROS, sleep apnea    Pulmonary exam normal breath sounds clear to auscultation       Cardiovascular negative cardio ROS Normal cardiovascular exam Rhythm:Regular Rate:Normal     Neuro/Psych  PSYCHIATRIC DISORDERS Anxiety      Neuromuscular disease negative neurological ROS  negative psych ROS   GI/Hepatic negative GI ROS, Neg liver ROS,,,  Endo/Other  negative endocrine ROSHypothyroidism    Renal/GU negative Renal ROS  negative genitourinary   Musculoskeletal negative musculoskeletal ROS (+) Arthritis ,    Abdominal   Peds negative pediatric ROS (+)  Hematology negative hematology ROS (+) Blood dyscrasia, anemia   Anesthesia Other Findings PVC (premature ventricular contraction) Anemia  Hypothyroidism Anxiety  Sweating abnormality Dyspareunia in female  PONV (postoperative nausea and vomiting)--zofran  4 mg ordered preop Wears contact lenses  Sleep apnea Arthritis      Reproductive/Obstetrics negative OB ROS                              Anesthesia Physical Anesthesia Plan  ASA: 2  Anesthesia Plan: General   Post-op Pain Management:    Induction: Intravenous  PONV Risk Score and Plan:   Airway Management Planned: Natural Airway and Nasal Cannula  Additional Equipment:   Intra-op Plan:   Post-operative Plan: Extubation in OR  Informed Consent: I have reviewed the patients History and Physical, chart, labs and discussed the procedure including the risks, benefits and  alternatives for the proposed anesthesia with the patient or authorized representative who has indicated his/her understanding and acceptance.     Dental Advisory Given  Plan Discussed with: Anesthesiologist, CRNA and Surgeon  Anesthesia Plan Comments: (Patient consented for risks of anesthesia including but not limited to:  - adverse reactions to medications - risk of airway placement if required - damage to eyes, teeth, lips or other oral mucosa - nerve damage due to positioning  - sore throat or hoarseness - Damage to heart, brain, nerves, lungs, other parts of body or loss of life  Patient voiced understanding and assent.)         Anesthesia Quick Evaluation

## 2024-01-10 ENCOUNTER — Encounter: Payer: Self-pay | Admitting: Podiatry

## 2024-01-13 ENCOUNTER — Encounter: Payer: Self-pay | Admitting: Podiatry

## 2024-01-14 ENCOUNTER — Encounter: Payer: Self-pay | Admitting: Podiatry

## 2024-01-15 ENCOUNTER — Encounter: Payer: Self-pay | Admitting: Internal Medicine

## 2024-01-15 ENCOUNTER — Other Ambulatory Visit: Payer: Self-pay

## 2024-01-15 MED ORDER — ONDANSETRON 4 MG PO TBDP
4.0000 mg | ORAL_TABLET | Freq: Three times a day (TID) | ORAL | 0 refills | Status: AC | PRN
  Filled 2024-01-15: qty 15, 5d supply, fill #0

## 2024-01-17 ENCOUNTER — Other Ambulatory Visit: Payer: Self-pay

## 2024-01-20 ENCOUNTER — Other Ambulatory Visit: Payer: Self-pay

## 2024-01-22 ENCOUNTER — Other Ambulatory Visit: Payer: Self-pay

## 2024-01-30 ENCOUNTER — Other Ambulatory Visit: Payer: Self-pay

## 2024-01-30 MED ORDER — TRAZODONE HCL 50 MG PO TABS
100.0000 mg | ORAL_TABLET | Freq: Every day | ORAL | 1 refills | Status: AC
  Filled 2024-01-30: qty 180, 90d supply, fill #0

## 2024-02-03 ENCOUNTER — Encounter: Payer: Self-pay | Admitting: Podiatry

## 2024-02-07 ENCOUNTER — Other Ambulatory Visit: Payer: Self-pay

## 2024-02-07 MED ORDER — FLUCONAZOLE 150 MG PO TABS
150.0000 mg | ORAL_TABLET | Freq: Once | ORAL | 0 refills | Status: AC
  Filled 2024-02-07: qty 1, 1d supply, fill #0

## 2024-02-20 ENCOUNTER — Other Ambulatory Visit: Payer: Self-pay

## 2024-02-21 ENCOUNTER — Other Ambulatory Visit: Payer: Self-pay

## 2024-02-21 MED ORDER — POLYSACCHARIDE IRON COMPLEX 150 MG PO CAPS
150.0000 mg | ORAL_CAPSULE | Freq: Every day | ORAL | 2 refills | Status: AC
  Filled 2024-02-21: qty 30, 30d supply, fill #0

## 2024-02-25 ENCOUNTER — Other Ambulatory Visit: Payer: Self-pay

## 2024-02-25 MED ORDER — FLUCONAZOLE 150 MG PO TABS
150.0000 mg | ORAL_TABLET | Freq: Once | ORAL | 0 refills | Status: AC
  Filled 2024-02-25: qty 1, 1d supply, fill #0
